# Patient Record
Sex: Female | Born: 1995 | Race: White | Hispanic: No | Marital: Single | Smoking: Never smoker
Health system: Southern US, Community
[De-identification: ages and names within clinical notes are randomized; demographics above are authoritative.]

## PROBLEM LIST (undated history)

## (undated) DIAGNOSIS — N920 Excessive and frequent menstruation with regular cycle: Secondary | ICD-10-CM

## (undated) DIAGNOSIS — R7989 Other specified abnormal findings of blood chemistry: Secondary | ICD-10-CM

## (undated) DIAGNOSIS — E229 Hyperfunction of pituitary gland, unspecified: Secondary | ICD-10-CM

## (undated) DIAGNOSIS — E282 Polycystic ovarian syndrome: Secondary | ICD-10-CM

## (undated) DIAGNOSIS — N946 Dysmenorrhea, unspecified: Secondary | ICD-10-CM

## (undated) HISTORY — PX: TONSILLECTOMY: SUR1361

## (undated) HISTORY — DX: Hyperfunction of pituitary gland, unspecified: E22.9

## (undated) HISTORY — DX: Polycystic ovarian syndrome: E28.2

## (undated) HISTORY — DX: Excessive and frequent menstruation with regular cycle: N92.0

## (undated) HISTORY — DX: Other specified abnormal findings of blood chemistry: R79.89

## (undated) HISTORY — DX: Dysmenorrhea, unspecified: N94.6

---
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
## 2015-01-31 DIAGNOSIS — E282 Polycystic ovarian syndrome: Secondary | ICD-10-CM | POA: Insufficient documentation

## 2015-01-31 DIAGNOSIS — L83 Acanthosis nigricans: Secondary | ICD-10-CM | POA: Insufficient documentation

## 2015-03-20 ENCOUNTER — Encounter: Payer: Self-pay | Admitting: Gynecology

## 2015-03-20 ENCOUNTER — Ambulatory Visit
Admission: EM | Admit: 2015-03-20 | Discharge: 2015-03-20 | Disposition: A | Discharge status: Home / Self Care | Payer: Managed Care, Other (non HMO) | Attending: Family Medicine | Admitting: Family Medicine

## 2015-03-20 DIAGNOSIS — J0110 Acute frontal sinusitis, unspecified: Secondary | ICD-10-CM | POA: Diagnosis not present

## 2015-03-20 DIAGNOSIS — J0100 Acute maxillary sinusitis, unspecified: Secondary | ICD-10-CM | POA: Diagnosis not present

## 2015-03-20 LAB — RAPID STREP SCREEN (MED CTR MEBANE ONLY): Streptococcus, Group A Screen (Direct): NEGATIVE

## 2015-03-20 MED ORDER — PREDNISONE 20 MG PO TABS: 40.0000 mg | ORAL_TABLET | Freq: Every day | ORAL | Status: DC

## 2015-03-20 MED ORDER — AMOXICILLIN-POT CLAVULANATE 875-125 MG PO TABS: 1.0000 | ORAL_TABLET | Freq: Two times a day (BID) | ORAL | Status: DC

## 2015-03-20 MED ORDER — BENZONATATE 100 MG PO CAPS: 100.0000 mg | ORAL_CAPSULE | Freq: Three times a day (TID) | ORAL | Status: DC | PRN

## 2015-03-20 NOTE — Discharge Instructions (Signed)
Take medication as prescribed. Rest. Eat and drink regularly.   Follow up closely with your primary care physician this week as needed. Return to Urgent care for new or worsening concerns.   Sinusitis, Adult Sinusitis is redness, soreness, and inflammation of the paranasal sinuses. Paranasal sinuses are air pockets within the bones of your face. They are located beneath your eyes, in the middle of your forehead, and above your eyes. In healthy paranasal sinuses, mucus is able to drain out, and air is able to circulate through them by way of your nose. However, when your paranasal sinuses are inflamed, mucus and air can become trapped. This can allow bacteria and other germs to grow and cause infection. Sinusitis can develop quickly and last only a short time (acute) or continue over a long period (chronic). Sinusitis that lasts for more than 12 weeks is considered chronic. CAUSES Causes of sinusitis include:  Allergies.  Structural abnormalities, such as displacement of the cartilage that separates your nostrils (deviated septum), which can decrease the air flow through your nose and sinuses and affect sinus drainage.  Functional abnormalities, such as when the small hairs (cilia) that line your sinuses and help remove mucus do not work properly or are not present. SIGNS AND SYMPTOMS Symptoms of acute and chronic sinusitis are the same. The primary symptoms are pain and pressure around the affected sinuses. Other symptoms include:  Upper toothache.  Earache.  Headache.  Bad breath.  Decreased sense of smell and taste.  A cough, which worsens when you are lying flat.  Fatigue.  Fever.  Thick drainage from your nose, which often is green and may contain pus (purulent).  Swelling and warmth over the affected sinuses. DIAGNOSIS Your health care provider will perform a physical exam. During your exam, your health care provider may perform any of the following to help determine if you  have acute sinusitis or chronic sinusitis:  Look in your nose for signs of abnormal growths in your nostrils (nasal polyps).  Tap over the affected sinus to check for signs of infection.  View the inside of your sinuses using an imaging device that has a light attached (endoscope). If your health care provider suspects that you have chronic sinusitis, one or more of the following tests may be recommended:  Allergy tests.  Nasal culture. A sample of mucus is taken from your nose, sent to a lab, and screened for bacteria.  Nasal cytology. A sample of mucus is taken from your nose and examined by your health care provider to determine if your sinusitis is related to an allergy. TREATMENT Most cases of acute sinusitis are related to a viral infection and will resolve on their own within 10 days. Sometimes, medicines are prescribed to help relieve symptoms of both acute and chronic sinusitis. These may include pain medicines, decongestants, nasal steroid sprays, or saline sprays. However, for sinusitis related to a bacterial infection, your health care provider will prescribe antibiotic medicines. These are medicines that will help kill the bacteria causing the infection. Rarely, sinusitis is caused by a fungal infection. In these cases, your health care provider will prescribe antifungal medicine. For some cases of chronic sinusitis, surgery is needed. Generally, these are cases in which sinusitis recurs more than 3 times per year, despite other treatments. HOME CARE INSTRUCTIONS  Drink plenty of water. Water helps thin the mucus so your sinuses can drain more easily.  Use a humidifier.  Inhale steam 3-4 times a day (for example, sit in  the bathroom with the shower running).  Apply a warm, moist washcloth to your face 3-4 times a day, or as directed by your health care provider.  Use saline nasal sprays to help moisten and clean your sinuses.  Take medicines only as directed by your health  care provider.  If you were prescribed either an antibiotic or antifungal medicine, finish it all even if you start to feel better. SEEK IMMEDIATE MEDICAL CARE IF:  You have increasing pain or severe headaches.  You have nausea, vomiting, or drowsiness.  You have swelling around your face.  You have vision problems.  You have a stiff neck.  You have difficulty breathing.   This information is not intended to replace advice given to you by your health care provider. Make sure you discuss any questions you have with your health care provider.   Document Released: 03/05/2005 Document Revised: 03/26/2014 Document Reviewed: 03/20/2011 Elsevier Interactive Patient Education [employer].

## 2015-03-20 NOTE — ED Provider Notes (Signed)
[facility]  ____________________________________________  Time seen: Approximately 3:42 PM  I have reviewed the triage vital signs and the nursing notes.   HISTORY  Chief Complaint URI   HPI Debbie Petersen is a 20 y.o. female presents with mother at bedside for the complaints of 7-8 days of runny nose, sore throat, cough, congestion, sinus pressure.denies known fevers. Reports continues to eat and drink well. Patient reports she is frequently around sick people while she is at work. Reports mother recently with similar.  Reports dry intermittent cough. States sore throat is scratchy and irritating at 5 out of 10.reports symptoms have been unrelieved with over-the-counter cough and congestion medicines and Mucinex.  PCP: Baboffe   History reviewed. No pertinent past medical history.  There are no active problems to display for this patient.   Past Surgical History  Procedure Laterality Date  . Tonsillectomy      Current Outpatient Rx  Name  Route  Sig  Dispense  Refill  .           .           .             Allergies Review of patient's allergies indicates no known allergies.  No family history on file.  Social History Social History  Substance Use Topics  . Smoking status: Never Smoker   . Smokeless tobacco: None  . Alcohol Use: No   LMP: 2 years ago, Depo Provera, Denies chance of pregnancy.   Review of Systems Constitutional: No fever/chills Eyes: No visual changes. ENT: Positive runny nose, congestion, sore throat.  Cardiovascular: Denies chest pain. Respiratory: Denies shortness of breath. Gastrointestinal: No abdominal pain.  No nausea, no vomiting.  No diarrhea.  No constipation. Genitourinary: Negative for dysuria. Musculoskeletal: Negative for back pain. Skin: Negative for rash. Neurological: Negative for headaches, focal weakness or numbness.  10-point ROS otherwise  negative.  ____________________________________________   PHYSICAL EXAM:  VITAL SIGNS: ED Triage Vitals  Enc Vitals Group     BP 03/20/15 1512 144/88 mmHg     Pulse Rate 03/20/15 1512 95     Resp 03/20/15 1512 18     Temp 03/20/15 1512 98.5 F (36.9 C)     Temp Source 03/20/15 1512 Oral     SpO2 03/20/15 1512 100 %     Weight 03/20/15 1512 250 lb (113.399 kg)     Height 03/20/15 1512 5\' 3"  (1.6 m)     Head Cir --      Peak Flow --      Pain Score 03/20/15 1512 7     Pain Loc --      Pain Edu? --      Excl. in GC? --     Constitutional: Alert and oriented. Well appearing and in no acute distress. Eyes: Conjunctivae are normal. PERRL. EOMI. Head: [place]DryCleaner.frAtraumatic.mild to moderate tenderness to palpation bilateral frontal and maxillary sinuses. No swelling. No erythema.  Ears: no erythema, normal TMs bilaterally.   Nose: nasal congestion with bilateral nasal turbinate erythema and edema.  Mouth/Throat: Mucous membranes are moist.  Mild pharyngeal erythema. No tonsillar swelling or exudate. No uvular shift or deviation. Neck: No stridor.  No cervical spine tenderness to palpation. Hematological/Lymphatic/Immunilogical: No cervical lymphadenopathy. Cardiovascular: Normal rate, regular rhythm. Grossly normal heart sounds.  Good peripheral circulation. Respiratory: Normal respiratory effort.  No retractions. Lungs CTAB. No wheezes, rales or rhonchi. dry intermittent cough. Good air movement. Gastrointestinal: Soft and nontender. Obese  abdomen. Musculoskeletal: No lower or upper extremity tenderness nor edema.   Bilateral pedal pulses equal and easily palpated.  Neurologic:  Normal speech and language. No gross focal neurologic deficits are appreciated. No gait instability.  Skin:  Skin is warm, dry and intact. No rash noted. Psychiatric: Mood and affect are normal. Speech and behavior are normal.  ____________________________________________   LABS  (all labs ordered are listed, but  only abnormal results are displayed)  Labs Reviewed  RAPID STREP SCREEN (NOT AT [facility])  CULTURE, GROUP A STREP ([facility] ONLY)   INITIAL IMPRESSION / ASSESSMENT AND PLAN / ED COURSE  Pertinent labs & imaging results that were available during my care of the patient were reviewed by me and considered in my medical decision making (see chart for details).  Very well-appearing patient. No acute distress. Presents for the complaints of 7-8 days of runny nose, nasal congestion, sinus pressure, sore throat and intermittent cough. Lungs clear throughout. Abdomen soft and nontender. Moist mucous membranes. Strep negative, will culture. Will treat sinusitis with oral Augmentin, and when necessary Tessalon Perles and prednisone. Encouraged rest, fluids, over-the-counter Claritin-D and PCP follow-up. Work note given for today and tomorrow.   Discussed follow up with Primary care physician this week. Discussed follow up and return parameters including no resolution or any worsening concerns. Patient verbalized understanding and agreed to plan.   ____________________________________________   FINAL CLINICAL IMPRESSION(S) / ED DIAGNOSES  Final diagnoses:  Acute frontal sinusitis, recurrence not specified  Acute maxillary sinusitis, recurrence not specified       Renford Dills, NP 03/20/15 1654

## 2015-03-20 NOTE — ED Notes (Signed)
Patient c/o coughing/ congestion /sore throat x 1 week.

## 2015-03-22 LAB — CULTURE, GROUP A STREP (THRC)

## 2016-07-20 ENCOUNTER — Encounter: Payer: Self-pay | Admitting: *Deleted

## 2016-07-26 ENCOUNTER — Ambulatory Visit (INDEPENDENT_AMBULATORY_CARE_PROVIDER_SITE_OTHER): Payer: Managed Care, Other (non HMO) | Admitting: Obstetrics and Gynecology

## 2016-07-26 ENCOUNTER — Encounter: Payer: Self-pay | Admitting: Obstetrics and Gynecology

## 2016-07-26 VITALS — BP 124/73 | HR 84 | Ht 62.0 in | Wt 254.7 lb

## 2016-07-26 DIAGNOSIS — Z79899 Other long term (current) drug therapy: Secondary | ICD-10-CM | POA: Diagnosis not present

## 2016-07-26 DIAGNOSIS — Z8742 Personal history of other diseases of the female genital tract: Secondary | ICD-10-CM | POA: Diagnosis not present

## 2016-07-26 MED ORDER — MEDROXYPROGESTERONE ACETATE 150 MG/ML IM SUSP: 150.0000 mg | INTRAMUSCULAR | 3 refills | Status: DC

## 2016-07-26 NOTE — Progress Notes (Signed)
Subjective:     Patient ID: Debbie Petersen, female   DOB: 04-06-1995, 21 y.o.   MRN: [id number]  HPI Has been on Depo for cycle supression & PCOS since 2012, needs refill. Denies any concerns. Is not having menstrual cycle. In school FT and works PT at [employer]. Has been exercising regularly and lost 25 lbs.   Review of Systems Negative     Objective:   Physical Exam A&O x4 Well groomed female in no distress Blood pressure 124/73, pulse 84, height 5\' 2"  (1.575 m), weight 254 lb 11.2 oz (115.5 kg). PE deferred( patient had to leave for work)    Assessment:     PCOS Obesity HC use    Plan:     Refilled Depo, will return at next open appointment for Physical and labs. Instructed to be fasting.  Melody StearnsShambley, CNM

## 2016-07-27 ENCOUNTER — Encounter: Payer: Managed Care, Other (non HMO) | Admitting: Obstetrics and Gynecology

## 2017-04-04 ENCOUNTER — Encounter: Payer: Self-pay | Admitting: Certified Nurse Midwife

## 2017-04-04 ENCOUNTER — Ambulatory Visit (INDEPENDENT_AMBULATORY_CARE_PROVIDER_SITE_OTHER): Payer: Managed Care, Other (non HMO) | Admitting: Certified Nurse Midwife

## 2017-04-04 VITALS — Temp 99.3°F | Ht 62.0 in | Wt 261.2 lb

## 2017-04-04 DIAGNOSIS — R300 Dysuria: Secondary | ICD-10-CM

## 2017-04-04 LAB — POCT URINALYSIS DIPSTICK
BILIRUBIN UA: NEGATIVE
GLUCOSE UA: NEGATIVE
Ketones, UA: NEGATIVE
Nitrite, UA: POSITIVE
Protein, UA: NEGATIVE
Spec Grav, UA: 1.01 (ref 1.010–1.025)
Urobilinogen, UA: 0.2 E.U./dL
pH, UA: 6 (ref 5.0–8.0)

## 2017-04-04 MED ORDER — NITROFURANTOIN MONOHYD MACRO 100 MG PO CAPS: 100.0000 mg | ORAL_CAPSULE | Freq: Two times a day (BID) | ORAL | 1 refills | Status: DC

## 2017-04-04 NOTE — Progress Notes (Signed)
SUBJECTIVE: Debbie Petersen is a 22 y.o. female who complains of urinary frequency, urgency, hematuria and dysuria x five (5) days, without flank pain, fever, chills, or abnormal vaginal discharge or bleeding.   OBJECTIVE: Appears well, in no apparent distress.  Vital signs are normal. The abdomen is soft without tenderness, guarding, mass, rebound or organomegaly. No CVA tenderness or inguinal adenopathy noted. Urine dipstick shows positive for RBC's, positive for nitrates and positive for leukocytes.   ASSESSMENT: Urinary frequency, urinary urgency, hematuria, Dysuria  PLAN: Treatment per orders - also push fluids, may use Uribel prn; samples given. Lab: urine culture, will contact pt via MyChart with results.Call or return to clinic prn if these symptoms worsen or fail to improve as anticipated.   Gunnar BullaJenkins Michelle Latoia Eyster, CNM [facility], [id number]

## 2017-04-04 NOTE — Patient Instructions (Addendum)
Urinary Tract Infection, Adult A urinary tract infection (UTI) is an infection of any part of the urinary tract. The urinary tract includes the:  Kidneys.  Ureters.  Bladder.  Urethra.  These organs make, store, and get rid of pee (urine) in the body. Follow these instructions at home:  Take over-the-counter and prescription medicines only as told by your doctor.  If you were prescribed an antibiotic medicine, take it as told by your doctor. Do not stop taking the antibiotic even if you start to feel better.  Avoid the following drinks: ? Alcohol. ? Caffeine. ? Tea. ? Carbonated drinks.  Drink enough fluid to keep your pee clear or pale yellow.  Keep all follow-up visits as told by your doctor. This is important.  Make sure to: ? Empty your bladder often and completely. Do not to hold pee for long periods of time. ? Empty your bladder before and after sex. ? Wipe from front to back after a bowel movement if you are female. Use each tissue one time when you wipe. Contact a doctor if:  You have back pain.  You have a fever.  You feel sick to your stomach (nauseous).  You throw up (vomit).  Your symptoms do not get better after 3 days.  Your symptoms go away and then come back. Get help right away if:  You have very bad back pain.  You have very bad lower belly (abdominal) pain.  You are throwing up and cannot keep down any medicines or water. This information is not intended to replace advice given to you by your health care provider. Make sure you discuss any questions you have with your health care provider. Document Released: 08/22/2007 Document Revised: 08/11/2015 Document Reviewed: 01/24/2015 Elsevier Interactive Patient Education  2018 Else[employer]  Nitrofurantoin tablets or capsules What is this medicine? NITROFURANTOIN (nye troe fyoor AN toyn) is an antibiotic. It is used to treat urinary tract infections. This medicine may be used for other  purposes; ask your health care provider or pharmacist if you have questions. COMMON BRAND NAME(S): Macrobid, Macrodantin, Urotoin What should I tell my health care provider before I take this medicine? They need to know if you have any of these conditions: -anemia -diabetes -glucose-6-phosphate dehydrogenase deficiency -kidney disease -liver disease -lung disease -other chronic illness -an unusual or allergic reaction to nitrofurantoin, other antibiotics, other medicines, foods, dyes or preservatives -pregnant or trying to get pregnant -breast-feeding How should I use this medicine? Take this medicine by mouth with a glass of water. Follow the directions on the prescription label. Take this medicine with food or milk. Take your doses at regular intervals. Do not take your medicine more often than directed. Do not stop taking except on your doctor's advice. Talk to your pediatrician regarding the use of this medicine in children. While this drug may be prescribed for selected conditions, precautions do apply. Overdosage: If you think you have taken too much of this medicine contact a poison control center or emergency room at once. NOTE: This medicine is only for you. Do not share this medicine with others. What if I miss a dose? If you miss a dose, take it as soon as you can. If it is almost time for your next dose, take only that dose. Do not take double or extra doses. What may interact with this medicine? -antacids containing magnesium trisilicate -probenecid -quinolone antibiotics like ciprofloxacin, lomefloxacin, norfloxacin and ofloxacin -sulfinpyrazone This list may not describe all possible interactions. Give   your health care provider a list of all the medicines, herbs, non-prescription drugs, or dietary supplements you use. Also tell them if you smoke, drink alcohol, or use illegal drugs. Some items may interact with your medicine. What should I watch for while using this  medicine? Tell your doctor or health care professional if your symptoms do not improve or if you get new symptoms. Drink several glasses of water a day. If you are taking this medicine for a long time, visit your doctor for regular checks on your progress. If you are diabetic, you may get a false positive result for sugar in your urine with certain brands of urine tests. Check with your doctor. What side effects may I notice from receiving this medicine? Side effects that you should report to your doctor or health care professional as soon as possible: -allergic reactions like skin rash or hives, swelling of the face, lips, or tongue -chest pain -cough -difficulty breathing -dizziness, drowsiness -fever or infection -joint aches or pains -pale or blue-tinted skin -redness, blistering, peeling or loosening of the skin, including inside the mouth -tingling, burning, pain, or numbness in hands or feet -unusual bleeding or bruising -unusually weak or tired -yellowing of eyes or skin Side effects that usually do not require medical attention (report to your doctor or health care professional if they continue or are bothersome): -dark urine -diarrhea -headache -loss of appetite -nausea or vomiting -temporary hair loss This list may not describe all possible side effects. Call your doctor for medical advice about side effects. You may report side effects to FDA at 1-800-FDA-1088. Where should I keep my medicine? Keep out of the reach of children. Store at room temperature between 15 and 30 degrees C (59 and 86 degrees F). Protect from light. Throw away any unused medicine after the expiration date. NOTE: This sheet is a summary. It may not cover all possible information. If you have questions about this medicine, talk to your doctor, pharmacist, or health care provider.  2018 Elsevier/Gold Standard (2007-09-24 15:56:47)  

## 2017-04-06 LAB — URINE CULTURE

## 2017-04-07 ENCOUNTER — Encounter: Payer: Self-pay | Admitting: Certified Nurse Midwife

## 2018-12-08 ENCOUNTER — Ambulatory Visit (INDEPENDENT_AMBULATORY_CARE_PROVIDER_SITE_OTHER): Payer: Managed Care, Other (non HMO) | Admitting: Certified Nurse Midwife

## 2018-12-08 ENCOUNTER — Other Ambulatory Visit: Payer: Self-pay

## 2018-12-08 ENCOUNTER — Other Ambulatory Visit (HOSPITAL_COMMUNITY)
Admission: RE | Admit: 2018-12-08 | Discharge: 2018-12-08 | Disposition: A | Discharge status: Home / Self Care | Payer: Managed Care, Other (non HMO) | Source: Ambulatory Visit | Attending: Certified Nurse Midwife | Admitting: Certified Nurse Midwife

## 2018-12-08 ENCOUNTER — Encounter: Payer: Self-pay | Admitting: Certified Nurse Midwife

## 2018-12-08 VITALS — BP 132/80 | HR 71 | Ht 62.0 in | Wt 267.8 lb

## 2018-12-08 DIAGNOSIS — Z124 Encounter for screening for malignant neoplasm of cervix: Secondary | ICD-10-CM | POA: Insufficient documentation

## 2018-12-08 DIAGNOSIS — Z01419 Encounter for gynecological examination (general) (routine) without abnormal findings: Secondary | ICD-10-CM | POA: Insufficient documentation

## 2018-12-08 DIAGNOSIS — N912 Amenorrhea, unspecified: Secondary | ICD-10-CM | POA: Diagnosis not present

## 2018-12-08 DIAGNOSIS — Z6841 Body Mass Index (BMI) 40.0 and over, adult: Secondary | ICD-10-CM

## 2018-12-08 DIAGNOSIS — Z8742 Personal history of other diseases of the female genital tract: Secondary | ICD-10-CM | POA: Diagnosis not present

## 2018-12-08 DIAGNOSIS — Z3009 Encounter for other general counseling and advice on contraception: Secondary | ICD-10-CM | POA: Diagnosis not present

## 2018-12-08 LAB — POCT URINE PREGNANCY: Preg Test, Ur: NEGATIVE

## 2018-12-08 MED ORDER — DROSPIRENONE-ETHINYL ESTRADIOL 3-0.02 MG PO TABS: 1.0000 | ORAL_TABLET | Freq: Every day | ORAL | 2 refills | Status: DC

## 2018-12-08 NOTE — Patient Instructions (Addendum)
Polycystic Ovarian Syndrome  Polycystic ovarian syndrome (PCOS) is a common hormonal disorder among women of reproductive age. In most women with PCOS, many small fluid-filled sacs (cysts) grow on the ovaries, and the cysts are not part of a normal menstrual cycle. PCOS can cause problems with your menstrual periods and make it difficult to get pregnant. It can also cause an increased risk of miscarriage with pregnancy. If it is not treated, PCOS can lead to serious health problems, such as diabetes and heart disease. What are the causes? The cause of PCOS is not known, but it may be the result of a combination of certain factors, such as:  Irregular menstrual cycle.  High levels of certain hormones (androgens).  Problems with the hormone that helps to control blood sugar (insulin resistance).  Certain genes. What increases the risk? This condition is more likely to develop in women who have a family history of PCOS. What are the signs or symptoms? Symptoms of PCOS may include:  Multiple ovarian cysts.  Infrequent periods or no periods.  Periods that are too frequent or too heavy.  Unpredictable periods.  Inability to get pregnant (infertility) because of not ovulating.  Increased growth of hair on the face, chest, stomach, back, thumbs, thighs, or toes.  Acne or oily skin. Acne may develop during adulthood, and it may not respond to treatment.  Pelvic pain.  Weight gain or obesity.  Patches of thickened and dark brown or black skin on the neck, arms, breasts, or thighs (acanthosis nigricans).  Excess hair growth on the face, chest, abdomen, or upper thighs (hirsutism). How is this diagnosed? This condition is diagnosed based on:  Your medical history.  A physical exam, including a pelvic exam. Your health care provider may look for areas of increased hair growth on your skin.  Tests, such as: ? Ultrasound. This may be used to examine the ovaries and the lining of the  uterus (endometrium) for cysts. ? Blood tests. These may be used to check levels of sugar (glucose), female hormone (testosterone), and female hormones (estrogen and progesterone) in your blood. How is this treated? There is no cure for PCOS, but treatment can help to manage symptoms and prevent more health problems from developing. Treatment varies depending on:  Your symptoms.  Whether you want to have a baby or whether you need birth control (contraception). Treatment may include nutrition and lifestyle changes along with:  Progesterone hormone to start a menstrual period.  Birth control pills to help you have regular menstrual periods.  Medicines to make you ovulate, if you want to get pregnant.  Medicine to reduce excessive hair growth.  Surgery, in severe cases. This may involve making small holes in one or both of your ovaries. This decreases the amount of testosterone that your body produces. Follow these instructions at home:  Take over-the-counter and prescription medicines only as told by your health care provider.  Follow a healthy meal plan. This can help you reduce the effects of PCOS. ? Eat a healthy diet that includes lean proteins, complex carbohydrates, fresh fruits and vegetables, low-fat dairy products, and healthy fats. Make sure to eat enough fiber.  If you are overweight, lose weight as told by your health care provider. ? Losing 10% of your body weight may improve symptoms. ? Your health care provider can determine how much weight loss is best for you and can help you lose weight safely.  Keep all follow-up visits as told by your health care provider.  This is important. Contact a health care provider if:  Your symptoms do not get better with medicine.  You develop new symptoms. This information is not intended to replace advice given to you by your health care provider. Make sure you discuss any questions you have with your health care provider. Document  Released: 06/29/2004 Document Revised: 02/15/2017 Document Reviewed: 08/21/2015 Elsevier Patient Education  2020 [place] for Polycystic Ovary Syndrome Polycystic ovary syndrome (PCOS) is a disorder of the chemicals (hormones) that regulate a woman's reproductive system, including monthly periods (menstruation). The condition causes important hormones to be out of balance. PCOS can:  Stop your periods or make them irregular.  Cause cysts to develop on your ovaries.  Make it difficult to get pregnant.  Stop your body from responding to the effects of insulin (insulin resistance). Insulin resistance can lead to obesity and diabetes. Changing what you eat can help you manage PCOS and improve your health. Following a balanced diet can help you lose weight and improve the way that your body uses insulin. What are tips for following this plan?  Follow a balanced diet for meals and snacks. Eat breakfast, lunch, dinner, and one or two snacks every day.  Include protein in each meal and snack.  Choose whole grains instead of products that are made with refined flour.  Eat a variety of foods.  Exercise regularly as told by your health care provider. Aim to do 30 or more minutes of exercise on most days of the week.  If you are overweight or obese: ? Pay attention to how many calories you eat. Cutting down on calories can help you lose weight. ? Work with your health care provider or a diet and nutrition specialist (dietitian) to figure out how many calories you need each day. What foods can I eat?  Fruits Include a variety of colors and types. All fruits are helpful for PCOS. Vegetables Include a variety of colors and types. All vegetables are helpful for PCOS. Grains Whole grains, such as whole wheat. Whole-grain breads, crackers, cereals, and pasta. Unsweetened oatmeal, bulgur, barley, quinoa, and brown rice. Tortillas made from corn or whole-wheat flour. Meats and other  proteins Low-fat (lean) proteins, such as fish, chicken, beans, eggs, and tofu. Dairy Low-fat dairy products, such as skim milk, cheese sticks, and yogurt. Beverages Low-fat or fat-free drinks, such as water, low-fat milk, sugar-free drinks, and small amounts of 100% fruit juice. Seasonings and condiments Ketchup. Mustard. Barbecue sauce. Relish. Low-fat or fat-free mayonnaise. Fats and oils Olive oil or canola oil. Walnuts and almonds. The items listed above may not be a complete list of recommended foods and beverages. Contact a dietitian for more options. What foods are not recommended? Foods that are high in calories or fat. Fried foods. Sweets. Products that are made from refined white flour, including white bread, pastries, white rice, and pasta. The items listed above may not be a complete list of foods and beverages to avoid. Contact a dietitian for more information. Summary  PCOS is a hormonal imbalance that affects a woman's reproductive system.  You can help to manage your PCOS by exercising regularly and eating a healthy, varied diet of vegetables, fruit, whole grains, low-fat (lean) protein, and low-fat dairy products.  Changing what you eat can improve the way that your body uses insulin, help your hormones reach normal levels, and help you lose weight. This information is not intended to replace advice given to you by your health care provider.  Make sure you discuss any questions you have with your health care provider. Document Released: 06/27/2015 Document Revised: 06/25/2018 Document Reviewed: 01/07/2017 Elsevier Patient Education  2020 [place] 79-64 Years Old, Female Preventive care refers to visits with your health care provider and lifestyle choices that can promote health and wellness. This includes:  A yearly physical exam. This may also be called an annual well check.  Regular dental visits and eye exams.  Immunizations.  Screening for  certain conditions.  Healthy lifestyle choices, such as eating a healthy diet, getting regular exercise, not using drugs or products that contain nicotine and tobacco, and limiting alcohol use. What can I expect for my preventive care visit? Physical exam Your health care provider will check your:  Height and weight. This may be used to calculate body mass index (BMI), which tells if you are at a healthy weight.  Heart rate and blood pressure.  Skin for abnormal spots. Counseling Your health care provider may ask you questions about your:  Alcohol, tobacco, and drug use.  Emotional well-being.  Home and relationship well-being.  Sexual activity.  Eating habits.  Work and work Statistician.  Method of birth control.  Menstrual cycle.  Pregnancy history. What immunizations do I need?  Influenza (flu) vaccine  This is recommended every year. Tetanus, diphtheria, and pertussis (Tdap) vaccine  You may need a Td booster every 10 years. Varicella (chickenpox) vaccine  You may need this if you have not been vaccinated. Human papillomavirus (HPV) vaccine  If recommended by your health care provider, you may need three doses over 6 months. Measles, mumps, and rubella (MMR) vaccine  You may need at least one dose of MMR. You may also need a second dose. Meningococcal conjugate (MenACWY) vaccine  One dose is recommended if you are age 51-21 years and a first-year college student living in a residence hall, or if you have one of several medical conditions. You may also need additional booster doses. Pneumococcal conjugate (PCV13) vaccine  You may need this if you have certain conditions and were not previously vaccinated. Pneumococcal polysaccharide (PPSV23) vaccine  You may need one or two doses if you smoke cigarettes or if you have certain conditions. Hepatitis A vaccine  You may need this if you have certain conditions or if you travel or work in places where you may  be exposed to hepatitis A. Hepatitis B vaccine  You may need this if you have certain conditions or if you travel or work in places where you may be exposed to hepatitis B. Haemophilus influenzae type b (Hib) vaccine  You may need this if you have certain conditions. You may receive vaccines as individual doses or as more than one vaccine together in one shot (combination vaccines). Talk with your health care provider about the risks and benefits of combination vaccines. What tests do I need?  Blood tests  Lipid and cholesterol levels. These may be checked every 5 years starting at age 14.  Hepatitis C test.  Hepatitis B test. Screening  Diabetes screening. This is done by checking your blood sugar (glucose) after you have not eaten for a while (fasting).  Sexually transmitted disease (STD) testing.  BRCA-related cancer screening. This may be done if you have a family history of breast, ovarian, tubal, or peritoneal cancers.  Pelvic exam and Pap test. This may be done every 3 years starting at age 41. Starting at age 38, this may be done every 5 years if you  have a Pap test in combination with an HPV test. Talk with your health care provider about your test results, treatment options, and if necessary, the need for more tests. Follow these instructions at home: Eating and drinking   Eat a diet that includes fresh fruits and vegetables, whole grains, lean protein, and low-fat dairy.  Take vitamin and mineral supplements as recommended by your health care provider.  Do not drink alcohol if: ? Your health care provider tells you not to drink. ? You are pregnant, may be pregnant, or are planning to become pregnant.  If you drink alcohol: ? Limit how much you have to 0-1 drink a day. ? Be aware of how much alcohol is in your drink. In the [place] one drink equals one 12 oz bottle of beer (355 mL), one 5 oz glass of wine (148 mL), or one 1 oz glass of hard liquor (44  mL). Lifestyle  Take daily care of your teeth and gums.  Stay active. Exercise for at least 30 minutes on 5 or more days each week.  Do not use any products that contain nicotine or tobacco, such as cigarettes, e-cigarettes, and chewing tobacco. If you need help quitting, ask your health care provider.  If you are sexually active, practice safe sex. Use a condom or other form of birth control (contraception) in order to prevent pregnancy and STIs (sexually transmitted infections). If you plan to become pregnant, see your health care provider for a preconception visit. What's next?  Visit your health care provider once a year for a well check visit.  Ask your health care provider how often you should have your eyes and teeth checked.  Stay up to date on all vaccines. This information is not intended to replace advice given to you by your health care provider. Make sure you discuss any questions you have with your health care provider. Document Released: 05/01/2001 Document Revised: 11/14/2017 Document Reviewed: 11/14/2017 Elsevier Patient Education  [place]; Ethinyl Estradiol tablets What is this medicine? DROSPIRENONE; ETHINYL ESTRADIOL (dro SPY re nown; ETH in il es tra DYE ole) is an oral contraceptive (birth control pill). This medicine combines two types of female hormones, an estrogen and a progestin. It is used to prevent ovulation and pregnancy. This medicine may be used for other purposes; ask your health care provider or pharmacist if you have questions. COMMON BRAND NAME(S): Standley Dakins, Lo-Zumandimine, Steele Sizer 28-Day, Ocella, Syeda, Vestura, Lauretta Grill, Zumandimine What should I tell my health care provider before I take this medicine? They need to know if you have or ever had any of these conditions:  abnormal vaginal bleeding  adrenal gland disease  blood vessel disease or blood clots  breast, cervical, endometrial, ovarian,  liver, or uterine cancer  diabetes  gallbladder disease  heart disease or recent heart attack  high blood pressure  high cholesterol  high potassium level  kidney disease  liver disease  migraine headaches  stroke  systemic lupus erythematosus (SLE)  tobacco smoker  an unusual or allergic reaction to estrogens, progestins, or other medicines, foods, dyes, or preservatives  pregnant or trying to get pregnant  breast-feeding How should I use this medicine? Take this medicine by mouth. To reduce nausea, this medicine may be taken with food. Follow the directions on the prescription label. Take this medicine at the same time each day and in the order directed on the package. Do not take your medicine more often than directed. A  patient package insert for the product will be given with each prescription and refill. Read this sheet carefully each time. The sheet may change frequently. Talk to your pediatrician regarding the use of this medicine in children. Special care may be needed. This medicine has been used in female children who have started having menstrual periods. Overdosage: If you think you have taken too much of this medicine contact a poison control center or emergency room at once. NOTE: This medicine is only for you. Do not share this medicine with others. What if I miss a dose? If you miss a dose, refer to the patient information sheet you received with your medicine for direction. If you miss more than one pill, this medicine may not be as effective and you may need to use another form of birth control. What may interact with this medicine? Do not take this medicine with any of the following medications:  aminoglutethimide  amprenavir, fosamprenavir  atazanavir; cobicistat  anastrozole  bosentan  exemestane  letrozole  metyrapone  testolactone This medicine may also interact with the following medications:  acetaminophen  antiviral medicines  for HIV or AIDS  aprepitant  barbiturates  certain antibiotics like rifampin, rifabutin, rifapentine, and possibly penicillins or tetracyclines  certain diuretics like amiloride, spironolactone, triamterene  certain medicines for fungal infections like griseofulvin, ketoconazole, itraconazole  certain medications for high blood pressure or heart conditions like ACE-inhibitors, Angiotensin-II receptor blockers, eplerenone  certain medicines for seizures like carbamazepine, oxcarbazepine, phenobarbital, phenytoin  cholestyramine  cobicistat  corticosteroid like hydrocortisone and prednisolone  cyclosporine  dantrolene  felbamate  grapefruit juice  heparin  lamotrigine  medicines for diabetes, including pioglitazone  modafinil  NSAIDs  potassium supplements  pyrimethamine  raloxifene  St. John's wort  sulfasalazine  tamoxifen  topiramate  thyroid hormones  warfarin his list may not describe all possible interactions. Give your health care provider a list of all the medicines, herbs, non-prescription drugs, or dietary supplements you use. Also tell them if you smoke, drink alcohol, or use illegal drugs. Some items may interact with your medicine. This list may not describe all possible interactions. Give your health care provider a list of all the medicines, herbs, non-prescription drugs, or dietary supplements you use. Also tell them if you smoke, drink alcohol, or use illegal drugs. Some items may interact with your medicine. What should I watch for while using this medicine? Visit your doctor or health care professional for regular checks on your progress. You will need a regular breast and pelvic exam and Pap smear while on this medicine. Use an additional method of contraception during the first cycle that you take these tablets. If you have any reason to think you are pregnant, stop taking this medicine right away and contact your doctor or health care  professional. If you are taking this medicine for hormone related problems, it may take several cycles of use to see improvement in your condition. Smoking increases the risk of getting a blood clot or having a stroke while you are taking birth control pills, especially if you are more than 23 years old. You are strongly advised not to smoke. This medicine can make your body retain fluid, making your fingers, hands, or ankles swell. Your blood pressure can go up. Contact your doctor or health care professional if you feel you are retaining fluid. This medicine can make you more sensitive to the sun. Keep out of the sun. If you cannot avoid being in the sun, wear protective  clothing and use sunscreen. Do not use sun lamps or tanning beds/booths. If you wear contact lenses and notice visual changes, or if the lenses begin to feel uncomfortable, consult your eye care specialist. In some women, tenderness, swelling, or minor bleeding of the gums may occur. Notify your dentist if this happens. Brushing and flossing your teeth regularly may help limit this. See your dentist regularly and inform your dentist of the medicines you are taking. If you are going to have elective surgery, you may need to stop taking this medicine before the surgery. Consult your health care professional for advice. This medicine does not protect you against HIV infection (AIDS) or any other sexually transmitted diseases. What side effects may I notice from receiving this medicine? Side effects that you should report to your doctor or health care professional as soon as possible:  allergic reactions like skin rash, itching or hives, swelling of the face, lips, or tongue  breast tissue changes or discharge  changes in vision  chest pain  confusion, trouble speaking or understanding  dark urine  general ill feeling or flu-like symptoms  light-colored stools  nausea, vomiting  pain, swelling, warmth in the leg  right  upper belly pain  severe headaches  shortness of breath  sudden numbness or weakness of the face, arm or leg  trouble walking, dizziness, loss of balance or coordination  unusual vaginal bleeding  yellowing of the eyes or skin Side effects that usually do not require medical attention (report to your doctor or health care professional if they continue or are bothersome):  acne  brown spots on the face  change in appetite  change in sexual desire  depressed mood or mood swings  fluid retention and swelling  stomach cramps or bloating  unusually weak or tired  weight gain This list may not describe all possible side effects. Call your doctor for medical advice about side effects. You may report side effects to FDA at 1-800-FDA-1088. Where should I keep my medicine? Keep out of the reach of children. Store at room temperature between 15 and 30 degrees C (59 and 86 degrees F). Throw away any unused medicine after the expiration date. NOTE: This sheet is a summary. It may not cover all possible information. If you have questions about this medicine, talk to your doctor, pharmacist, or health care provider.  2020 Elsevier/[employer] Standard (2015-11-25 13:52:56)

## 2018-12-08 NOTE — Progress Notes (Signed)
ANNUAL PREVENTATIVE CARE GYN  ENCOUNTER NOTE  Subjective:       Debbie Petersen is a 23 y.o. G0P0000 female here for a routine annual gynecologic exam.  Current complaints: 1. Needs Pap smear 2. Wishes to restart Depo-provera injections or another form of contraception  History significant for PCOS. Declines flu vaccine.   Denies difficulty breathing or respiratory distress, chest pain, abdominal pain, excessive vaginal bleeding, dysuria, and leg pain or swelling.    Gynecologic History  Patient's last menstrual period was 07/09/2014 (exact date).   Contraception: condoms  Last Pap: due.   Obstetric History  OB History  Gravida Para Term Preterm AB Living  0 0 0 0 0 0  SAB TAB Ectopic Multiple Live Births  0 0 0 0 0    Past Medical History:  Diagnosis Date  . Abnormal thyroid stimulating hormone (TSH) level   . Elevated prolactin level ([place])   . Menorrhagia   . Painful menstrual periods   . PCOS (polycystic ovarian syndrome)     Past Surgical History:  Procedure Laterality Date  . TONSILLECTOMY      Allergies  Allergen Reactions  . Metformin Other (See Comments)    GI upset    Social History   Socioeconomic History  . Marital status: Single    Spouse name: Not on file  . Number of children: Not on file  . Years of education: Not on file  . Highest education level: Not on file  Occupational History  . Not on file  Social Needs  . Financial resource strain: Not on file  . Food insecurity    Worry: Not on file    Inability: Not on file  . Transportation needs    Medical: Not on file    Non-medical: Not on file  Tobacco Use  . Smoking status: Never Smoker  . Smokeless tobacco: Never Used  Substance and Sexual Activity  . Alcohol use: No  . Drug use: No  . Sexual activity: Yes    Birth control/protection: Condom  Lifestyle  . Physical activity    Days per week: Not on file    Minutes per session: Not on file  . Stress: Not on file   Relationships  . Social Herbalist on phone: Not on file    Gets together: Not on file    Attends religious service: Not on file    Active member of club or organization: Not on file    Attends meetings of clubs or organizations: Not on file    Relationship status: Not on file  . Intimate partner violence    Fear of current or ex partner: Not on file    Emotionally abused: Not on file    Physically abused: Not on file    Forced sexual activity: Not on file  Other Topics Concern  . Not on file  Social History Narrative  . Not on file    Family History  Problem Relation Age of Onset  . Cancer Paternal Aunt        breast  . Breast cancer Neg Hx   . Ovarian cancer Neg Hx   . Colon cancer Neg Hx     The following portions of the patient's history were reviewed and updated as appropriate: allergies, current medications, past family history, past medical history, past social history, past surgical history and problem list.  Review of Systems  ROS negative except as noted above. Information obtained from patient.  Objective:   BP 132/80   Pulse 71   Ht 5\' 2"  (1.575 m)   Wt 267 lb 12.8 oz (121.5 kg)   LMP 07/09/2014 (Exact Date)   BMI 48.98 kg/m   CONSTITUTIONAL: Well-developed, well-nourished female in no acute distress.   PSYCHIATRIC: Normal mood and affect. Normal behavior. Normal judgment and thought content.  NEUROLGIC: Alert and oriented to person, place, and time.  Normal muscle tone coordination. No cranial nerve deficit noted.  HENT:  Normocephalic, atraumatic, External right and left ear normal.   EYES: Conjunctivae and EOM are normal. Pupils are equal and round.   NECK: Normal range of motion, supple, no masses.  Normal thyroid.   SKIN: Skin is warm and dry. No rash noted. Not diaphoretic. No erythema. No pallor.  CARDIOVASCULAR: Normal heart rate noted, regular rhythm, no murmur.  RESPIRATORY: Clear to auscultation bilaterally. Effort and  breath sounds normal, no problems with respiration noted.  BREASTS: Symmetric in size. No masses, skin changes, nipple drainage, or lymphadenopathy.  ABDOMEN: Soft, normal bowel sounds, no distention noted.  No tenderness, rebound or guarding. Obese.   PELVIC:  External Genitalia: Normal  Vagina: Normal  Cervix: Normal, Pap collected  Uterus: Normal  Adnexa: Normal  MUSCULOSKELETAL: Normal range of motion. No tenderness.  No cyanosis, clubbing, or edema.  2+ distal pulses.  LYMPHATIC: No Axillary, Supraclavicular, or Inguinal Adenopathy.  Recent Results (from the past 2160 hour(s))  POCT urine pregnancy     Status: None   Collection Time: 12/08/18  3:51 PM  Result Value Ref Range   Preg Test, Ur Negative Negative     Assessment:   Annual gynecologic examination 23 y.o.   Contraception: OCP (estrogen/progesterone), Yaz   Obesity 3   Problem List Items Addressed This Visit    None    Visit Diagnoses    Well woman exam    -  Primary   Relevant Orders   Cytology - PAP   Thyroid Panel With TSH   FSH/LH   Testosterone, Free, Total, SHBG   Prolactin   DHEA-sulfate   Lipid panel   Hemoglobin A1c   Progesterone   History of PCOS       Relevant Orders   Thyroid Panel With TSH   FSH/LH   Testosterone, Free, Total, SHBG   Prolactin   DHEA-sulfate   Lipid panel   Hemoglobin A1c   Progesterone   Screening for cervical cancer       Relevant Orders   Cytology - PAP   Encounter for counseling regarding contraception       BMI 45.0-49.9, adult (HCC)       Relevant Orders   Thyroid Panel With TSH   FSH/LH   Prolactin   Lipid panel   Hemoglobin A1c   Amenorrhea       Relevant Orders   POCT urine pregnancy (Completed)   Thyroid Panel With TSH   FSH/LH   Prolactin      Plan:   Pap: Pap, Reflex if ASCUS  Labs: See orders   Routine preventative health maintenance measures emphasized: Exercise/Diet/Weight control, Tobacco Warnings, Alcohol/Substance use  risks, Stress Management, Peer Pressure Issues and Safe Sex; see AVS  Rx: Yaz, see orders  Reviewed red flag symptoms and when to call  RTC x 1 year for ANNUAL EXAM or sooner if needed   Gunnar BullaJenkins Michelle Leonardo Plaia, CNM [facility], [facility] 12/08/18 4:00 PM

## 2018-12-08 NOTE — Progress Notes (Signed)
Patient here for annual exam.  Patient wants to discuss birth control options.  Previously on Depo, last Depo was >1 year ago.

## 2018-12-16 ENCOUNTER — Other Ambulatory Visit: Payer: Self-pay

## 2018-12-16 ENCOUNTER — Other Ambulatory Visit: Payer: Managed Care, Other (non HMO)

## 2018-12-18 LAB — THYROID PANEL WITH TSH
Free Thyroxine Index: 1.9 (ref 1.2–4.9)
T3 Uptake Ratio: 25 % (ref 24–39)
T4, Total: 7.6 ug/dL (ref 4.5–12.0)
TSH: 3.51 u[IU]/mL (ref 0.450–4.500)

## 2018-12-18 LAB — PROGESTERONE: Progesterone: 0.4 ng/mL

## 2018-12-18 LAB — FSH/LH
FSH: 5.3 m[IU]/mL
LH: 13.3 m[IU]/mL

## 2018-12-18 LAB — LIPID PANEL
Chol/HDL Ratio: 3.5 ratio (ref 0.0–4.4)
Cholesterol, Total: 159 mg/dL (ref 100–199)
HDL: 46 mg/dL (ref >39)
LDL Chol Calc (NIH): 92 mg/dL (ref 0–99)
Triglycerides: 114 mg/dL (ref 0–149)
VLDL Cholesterol Cal: 21 mg/dL (ref 5–40)

## 2018-12-18 LAB — TESTOSTERONE, FREE, TOTAL, SHBG
Sex Hormone Binding: 30.4 nmol/L (ref 24.6–122.0)
Testosterone, Free: 4.4 pg/mL — ABNORMAL HIGH (ref 0.0–4.2)
Testosterone: 61 ng/dL — ABNORMAL HIGH (ref 8–48)

## 2018-12-18 LAB — PROLACTIN: Prolactin: 20.7 ng/mL (ref 4.8–23.3)

## 2018-12-18 LAB — HEMOGLOBIN A1C
Est. average glucose Bld gHb Est-mCnc: 97 mg/dL
Hgb A1c MFr Bld: 5 % (ref 4.8–5.6)

## 2018-12-18 LAB — DHEA-SULFATE: DHEA-SO4: 467 ug/dL — ABNORMAL HIGH (ref 110.0–431.7)

## 2018-12-19 ENCOUNTER — Encounter: Payer: Self-pay | Admitting: Certified Nurse Midwife

## 2019-01-08 ENCOUNTER — Encounter: Payer: Self-pay | Admitting: Certified Nurse Midwife

## 2019-03-03 ENCOUNTER — Other Ambulatory Visit: Payer: Self-pay | Admitting: Certified Nurse Midwife

## 2019-03-30 ENCOUNTER — Ambulatory Visit (INDEPENDENT_AMBULATORY_CARE_PROVIDER_SITE_OTHER): Payer: Managed Care, Other (non HMO) | Admitting: Certified Nurse Midwife

## 2019-03-30 ENCOUNTER — Other Ambulatory Visit: Payer: Self-pay

## 2019-03-30 ENCOUNTER — Encounter: Payer: Self-pay | Admitting: Certified Nurse Midwife

## 2019-03-30 VITALS — BP 117/81 | HR 84 | Ht 62.0 in | Wt 268.1 lb

## 2019-03-30 DIAGNOSIS — N912 Amenorrhea, unspecified: Secondary | ICD-10-CM | POA: Diagnosis not present

## 2019-03-30 DIAGNOSIS — E669 Obesity, unspecified: Secondary | ICD-10-CM | POA: Insufficient documentation

## 2019-03-30 LAB — POCT URINE PREGNANCY: Preg Test, Ur: NEGATIVE

## 2019-03-30 NOTE — Progress Notes (Signed)
GYN ENCOUNTER NOTE  Subjective:       Debbie Petersen is a 24 y.o. G0P0000 female is here for gynecologic evaluation of the following issues:  1. Trying to conceive. Pt has history of PCOS. States that she is 3 days late on cycle and had positive pregnancy test at home. She has been off of yaz for a few months and has had regular cycles . She has used an ovulation kit that has been positive for ovulation.    Gynecologic History Patient's last menstrual period was 03/01/2019. Contraception: none Last Pap: 12/08/18 -active in progress Last mammogram: n/a   Obstetric History OB History  Gravida Para Term Preterm AB Living  0 0 0 0 0 0  SAB TAB Ectopic Multiple Live Births  0 0 0 0 0    Past Medical History:  Diagnosis Date  . Abnormal thyroid stimulating hormone (TSH) level   . Elevated prolactin level   . Menorrhagia   . Painful menstrual periods   . PCOS (polycystic ovarian syndrome)     Past Surgical History:  Procedure Laterality Date  . TONSILLECTOMY      Current Outpatient Medications on File Prior to Visit  Medication Sig Dispense Refill  . [name] 3-0.02 MG tablet TAKE 1 TABLET BY MOUTH EVERY DAY (Patient not taking: Reported on 03/30/2019) 28 tablet 2   No current facility-administered medications on file prior to visit.    Allergies  Allergen Reactions  . Metformin Other (See Comments)    GI upset    Social History   Socioeconomic History  . Marital status: Single    Spouse name: Not on file  . Number of children: Not on file  . Years of education: Not on file  . Highest education level: Not on file  Occupational History  . Not on file  Tobacco Use  . Smoking status: Never Smoker  . Smokeless tobacco: Never Used  Substance and Sexual Activity  . Alcohol use: No  . Drug use: No  . Sexual activity: Yes    Birth control/protection: None  Other Topics Concern  . Not on file  Social History Narrative  . Not on file   Social Determinants of Health    Financial Resource Strain:   . Difficulty of Paying Living Expenses: Not on file  Food Insecurity:   . Worried About Charity fundraiser in the Last Year: Not on file  . Ran Out of Food in the Last Year: Not on file  Transportation Needs:   . Lack of Transportation (Medical): Not on file  . Lack of Transportation (Non-Medical): Not on file  Physical Activity:   . Days of Exercise per Week: Not on file  . Minutes of Exercise per Session: Not on file  Stress:   . Feeling of Stress : Not on file  Social Connections:   . Frequency of Communication with Friends and Family: Not on file  . Frequency of Social Gatherings with Friends and Family: Not on file  . Attends Religious Services: Not on file  . Active Member of Clubs or Organizations: Not on file  . Attends Archivist Meetings: Not on file  . Marital Status: Not on file  Intimate Partner Violence:   . Fear of Current or Ex-Partner: Not on file  . Emotionally Abused: Not on file  . Physically Abused: Not on file  . Sexually Abused: Not on file    Family History  Problem Relation Age of Onset  .  Cancer Paternal Aunt        breast  . Breast cancer Neg Hx   . Ovarian cancer Neg Hx   . Colon cancer Neg Hx     The following portions of the patient's history were reviewed and updated as appropriate: allergies, current medications, past family history, past medical history, past social history, past surgical history and problem list.  Review of Systems Review of Systems - Negative except as mentioend in HPI Review of Systems - General ROS: negative for - chills, fatigue, fever, hot flashes, malaise or night sweats Hematological and Lymphatic ROS: negative for - bleeding problems or swollen lymph nodes Gastrointestinal ROS: negative for - abdominal pain, blood in stools, change in bowel habits and nausea/vomiting Musculoskeletal ROS: negative for - joint pain, muscle pain or muscular weakness Genito-Urinary ROS:  negative for - change in menstrual cycle, dysmenorrhea, dyspareunia, dysuria, genital discharge, genital ulcers, hematuria, incontinence, irregular/heavy menses, nocturia or pelvic pain  Objective:   BP 117/81   Pulse 84   Ht 5' 2"  (1.575 m)   Wt 268 lb 2 oz (121.6 kg)   LMP 03/01/2019   BMI 49.04 kg/m  CONSTITUTIONAL: Well-developed, well-nourished, obese female in no acute distress.  HENT:  Normocephalic, atraumatic.  NECK: Normal range of motion, supple, no masses.  Normal thyroid.  SKIN: Skin is warm and dry. No rash noted. Not diaphoretic. No erythema. No pallor. Louann: Alert and oriented to person, place, and time. PSYCHIATRIC: Normal mood and affect. Normal behavior. Normal judgment and thought content. CARDIOVASCULAR:Not Examined RESPIRATORY: Not Examined BREASTS: Not Examined ABDOMEN: Soft, non distended; Non tender.  No Organomegaly. PELVIC:not indicated to discuss conception  MUSCULOSKELETAL: Normal range of motion. No tenderness.  No cyanosis, clubbing, or edema.     Assessment:   1. Amenorrhea - POCT urine pregnancy - Beta HCG, Quant     Plan:   Discussed irregular cycles with PCOS and use of BC to help regulate cycle. UPT-negative. Beta hcg ordered. Discussed if negative to resume yaz to regulate cycle , then use the ovulation kit and try for 2 more times. IF unsuccessful then will discuss use of clomid to assist with ovulation.She was on Metformin in the past but can not tolerate due to upset stomach.  Also discussed weight loss to improve fertility. She verbalizes understanding. Will follow up with lab results.   I attest more than 50% of visit spent reviewing history discussing PCOS/ovulation/regular cycle/weight loss and fertility . Developing a plan of care. Face to face time 15 min.   Philip Aspen, CNM

## 2019-03-30 NOTE — Patient Instructions (Signed)
Polycystic Ovarian Syndrome  Polycystic ovarian syndrome (PCOS) is a common hormonal disorder among women of reproductive age. In most women with PCOS, many small fluid-filled sacs (cysts) grow on the ovaries, and the cysts are not part of a normal menstrual cycle. PCOS can cause problems with your menstrual periods and make it difficult to get pregnant. It can also cause an increased risk of miscarriage with pregnancy. If it is not treated, PCOS can lead to serious health problems, such as diabetes and heart disease. What are the causes? The cause of PCOS is not known, but it may be the result of a combination of certain factors, such as:  Irregular menstrual cycle.  High levels of certain hormones (androgens).  Problems with the hormone that helps to control blood sugar (insulin resistance).  Certain genes. What increases the risk? This condition is more likely to develop in women who have a family history of PCOS. What are the signs or symptoms? Symptoms of PCOS may include:  Multiple ovarian cysts.  Infrequent periods or no periods.  Periods that are too frequent or too heavy.  Unpredictable periods.  Inability to get pregnant (infertility) because of not ovulating.  Increased growth of hair on the face, chest, stomach, back, thumbs, thighs, or toes.  Acne or oily skin. Acne may develop during adulthood, and it may not respond to treatment.  Pelvic pain.  Weight gain or obesity.  Patches of thickened and dark brown or black skin on the neck, arms, breasts, or thighs (acanthosis nigricans).  Excess hair growth on the face, chest, abdomen, or upper thighs (hirsutism). How is this diagnosed? This condition is diagnosed based on:  Your medical history.  A physical exam, including a pelvic exam. Your health care provider may look for areas of increased hair growth on your skin.  Tests, such as: ? Ultrasound. This may be used to examine the ovaries and the lining of the  uterus (endometrium) for cysts. ? Blood tests. These may be used to check levels of sugar (glucose), female hormone (testosterone), and female hormones (estrogen and progesterone) in your blood. How is this treated? There is no cure for PCOS, but treatment can help to manage symptoms and prevent more health problems from developing. Treatment varies depending on:  Your symptoms.  Whether you want to have a baby or whether you need birth control (contraception). Treatment may include nutrition and lifestyle changes along with:  Progesterone hormone to start a menstrual period.  Birth control pills to help you have regular menstrual periods.  Medicines to make you ovulate, if you want to get pregnant.  Medicine to reduce excessive hair growth.  Surgery, in severe cases. This may involve making small holes in one or both of your ovaries. This decreases the amount of testosterone that your body produces. Follow these instructions at home:  Take over-the-counter and prescription medicines only as told by your health care provider.  Follow a healthy meal plan. This can help you reduce the effects of PCOS. ? Eat a healthy diet that includes lean proteins, complex carbohydrates, fresh fruits and vegetables, low-fat dairy products, and healthy fats. Make sure to eat enough fiber.  If you are overweight, lose weight as told by your health care provider. ? Losing 10% of your body weight may improve symptoms. ? Your health care provider can determine how much weight loss is best for you and can help you lose weight safely.  Keep all follow-up visits as told by your health care provider.   This is important. Contact a health care provider if:  Your symptoms do not get better with medicine.  You develop new symptoms. This information is not intended to replace advice given to you by your health care provider. Make sure you discuss any questions you have with your health care provider. Document  Revised: 02/15/2017 Document Reviewed: 08/21/2015 Elsevier Patient Education  2020 Elsevier Inc.  

## 2019-03-31 ENCOUNTER — Other Ambulatory Visit: Payer: Self-pay | Admitting: Certified Nurse Midwife

## 2019-03-31 DIAGNOSIS — N912 Amenorrhea, unspecified: Secondary | ICD-10-CM

## 2019-03-31 LAB — BETA HCG QUANT (REF LAB): hCG Quant: 41 m[IU]/mL

## 2019-03-31 NOTE — Progress Notes (Signed)
Repeat beta quant ordered for Friday. PT notified via my chart.   Doreene Burke, CNM

## 2019-04-03 ENCOUNTER — Other Ambulatory Visit: Payer: Managed Care, Other (non HMO)

## 2019-04-03 ENCOUNTER — Other Ambulatory Visit: Payer: Self-pay

## 2019-04-03 DIAGNOSIS — N912 Amenorrhea, unspecified: Secondary | ICD-10-CM

## 2019-04-04 LAB — BETA HCG QUANT (REF LAB): hCG Quant: 44 m[IU]/mL

## 2019-04-06 ENCOUNTER — Other Ambulatory Visit: Payer: Self-pay | Admitting: Certified Nurse Midwife

## 2019-04-06 DIAGNOSIS — N912 Amenorrhea, unspecified: Secondary | ICD-10-CM

## 2019-04-06 NOTE — Progress Notes (Signed)
Repeat beta quant level ordered. Pt quant not increasing as expected for valid pregnancy. Pt notified via my chart.   Doreene Burke, CNM

## 2019-04-16 ENCOUNTER — Telehealth: Payer: Self-pay | Admitting: Certified Nurse Midwife

## 2019-04-16 NOTE — Telephone Encounter (Signed)
Pt called in and stated that she has been seen before and her hcg levels where low and the pt was told that she needs higher levels. Then pt started bleeding pt didn't know it was her period pt stated she is still bleeding not as heavy. Pt took a pregnancy test and it came back. But the pt doesn't know if it is true or its her PCOS. Pt didn't know if she needed to be seen. Please let me know if I need to book her an appt. Of if it normal for the pt.   The pt said that she was told that her test with annie did say she was perg.  Please advise

## 2019-04-20 ENCOUNTER — Other Ambulatory Visit: Payer: Self-pay

## 2019-04-20 ENCOUNTER — Other Ambulatory Visit: Payer: Managed Care, Other (non HMO)

## 2019-04-20 DIAGNOSIS — N912 Amenorrhea, unspecified: Secondary | ICD-10-CM

## 2019-04-21 ENCOUNTER — Telehealth: Payer: Self-pay | Admitting: Certified Nurse Midwife

## 2019-04-21 ENCOUNTER — Telehealth: Payer: Self-pay

## 2019-04-21 LAB — BETA HCG QUANT (REF LAB): hCG Quant: 171 m[IU]/mL

## 2019-04-21 NOTE — Telephone Encounter (Signed)
Pt called back and asked why she had not been called back. I told her that we have 24 hours to call back.

## 2019-04-21 NOTE — Telephone Encounter (Signed)
Pt called in and stated that she got her results back from her blood test and the pt stated that she is bleeding the pt is requesting call back. Please advise

## 2019-04-21 NOTE — Telephone Encounter (Signed)
mychart message sent to patient

## 2019-04-28 ENCOUNTER — Other Ambulatory Visit: Payer: Managed Care, Other (non HMO)

## 2019-04-30 ENCOUNTER — Ambulatory Visit (INDEPENDENT_AMBULATORY_CARE_PROVIDER_SITE_OTHER): Payer: Managed Care, Other (non HMO)

## 2019-04-30 ENCOUNTER — Other Ambulatory Visit: Payer: Self-pay | Admitting: Certified Nurse Midwife

## 2019-04-30 ENCOUNTER — Other Ambulatory Visit: Payer: Self-pay

## 2019-04-30 DIAGNOSIS — N939 Abnormal uterine and vaginal bleeding, unspecified: Secondary | ICD-10-CM

## 2019-05-09 ENCOUNTER — Other Ambulatory Visit: Payer: Self-pay | Admitting: Certified Nurse Midwife

## 2019-05-09 DIAGNOSIS — N926 Irregular menstruation, unspecified: Secondary | ICD-10-CM

## 2019-05-09 NOTE — Progress Notes (Signed)
Elevated quant level , u/s does not show pregnancy. Repeat quant ordered.   Doreene Burke, CNM

## 2019-06-03 ENCOUNTER — Other Ambulatory Visit: Payer: Self-pay

## 2019-06-03 ENCOUNTER — Other Ambulatory Visit: Payer: Managed Care, Other (non HMO)

## 2019-06-03 DIAGNOSIS — N926 Irregular menstruation, unspecified: Secondary | ICD-10-CM

## 2019-06-04 LAB — BETA HCG QUANT (REF LAB): hCG Quant: <1 m[IU]/mL

## 2019-06-29 ENCOUNTER — Other Ambulatory Visit: Payer: Self-pay | Admitting: Obstetrics and Gynecology

## 2019-09-19 ENCOUNTER — Other Ambulatory Visit: Payer: Self-pay

## 2019-09-19 ENCOUNTER — Emergency Department (HOSPITAL_COMMUNITY)
Admission: EM | Admit: 2019-09-19 | Discharge: 2019-09-19 | Disposition: A | Discharge status: Home / Self Care | Payer: Managed Care, Other (non HMO) | Attending: Emergency Medicine | Admitting: Emergency Medicine

## 2019-09-19 ENCOUNTER — Encounter (HOSPITAL_COMMUNITY): Payer: Self-pay | Admitting: Physician Assistant

## 2019-09-19 DIAGNOSIS — R21 Rash and other nonspecific skin eruption: Secondary | ICD-10-CM | POA: Insufficient documentation

## 2019-09-19 NOTE — ED Triage Notes (Signed)
Pt c/o rash all over body that started x 2 days ago. Pt states she has been taking benadryl x 2 days. Pt is 3 months pregnant.

## 2019-09-19 NOTE — ED Notes (Signed)
ED Provider at bedside. 

## 2019-09-19 NOTE — Discharge Instructions (Addendum)
As we discussed today I would recommend making sure that your pets are up-to-date on flea/tick treatment, washing your sheets and clothing in hot water, evaluating any close partners for rashes or lesions.    Please take a picture of your rash every day so that when you follow-up with your primary care doctor they can see the rash.  If you develop fevers, worsening symptoms, or have other concerns please seek additional medical care and evaluation.

## 2019-09-19 NOTE — ED Provider Notes (Signed)
[facility] EMERGENCY DEPARTMENT Provider Note   CSN: [id number] Arrival date & time: 09/19/19  4098     History Chief Complaint  Patient presents with  . Rash    Debbie Petersen is a 24 y.o. female with a past medical history of PCOS who presents today for evaluation of a rash.  She reports that 2 to 3 days ago she developed a rash.  It started on her right upper arm, has spread to her abdomen and right leg.  Her significant other does not have similar lesions.  She denies any known exposures, no new soaps lotions detergents or other products.  No new medications.  She does note that she is about 3 months pregnant.  Is not having any spotting or bleeding.  She Benadryl which she says provides mild temporary relief and has tried calamine lotion without significant success.  Denies any known bug exposures including no known bug bites or flea exposure.  HPI     Past Medical History:  Diagnosis Date  . Abnormal thyroid stimulating hormone (TSH) level   . Elevated prolactin level   . Menorrhagia   . Painful menstrual periods   . PCOS (polycystic ovarian syndrome)     Patient Active Problem List   Diagnosis Date Noted  . Obesity 03/30/2019  . PCOS (polycystic ovarian syndrome) 01/31/2015  . Acanthosis nigricans 01/31/2015    Past Surgical History:  Procedure Laterality Date  . TONSILLECTOMY       OB History    Gravida  1   Para  0   Term  0   Preterm  0   AB  0   Living  0     SAB  0   TAB  0   Ectopic  0   Multiple  0   Live Births  0           Family History  Problem Relation Age of Onset  . Cancer Paternal Aunt        breast  . Breast cancer Neg Hx   . Ovarian cancer Neg Hx   . Colon cancer Neg Hx     Social History   Tobacco Use  . Smoking status: Never Smoker  . Smokeless tobacco: Never Used  Vaping Use  . Vaping Use: Never used  Substance Use Topics  . Alcohol use: No  . Drug use: No    Home Medications Prior to Admission  medications   Medication Sig Start Date End Date Taking? Authorizing Provider  [name] 3-0.02 MG tablet TAKE 1 TABLET BY MOUTH EVERY DAY Patient not taking: Reported on 03/30/2019 03/03/19   Hildred Laser, MD    Allergies    Metformin  Review of Systems   Review of Systems  Constitutional: Negative for chills, fatigue and fever.  Respiratory: Negative for cough and shortness of breath.   Gastrointestinal: Negative for abdominal pain.  Genitourinary: Negative for pelvic pain, vaginal bleeding and vaginal discharge.  Musculoskeletal: Negative for back pain.  Skin: Positive for rash.  Neurological: Negative for weakness and headaches.  All other systems reviewed and are negative.   Physical Exam Updated Vital Signs BP 133/66   Pulse 78   Temp 98.6 F (37 C) (Oral)   Resp 18   Ht 5\' 2"  (1.575 m)   Wt 117.9 kg   LMP 07/31/2019   SpO2 100%   BMI 47.55 kg/m   Physical Exam Vitals and nursing note reviewed.  Constitutional:  General: She is not in acute distress.    Appearance: She is not ill-appearing.  HENT:     Head: Normocephalic.  Cardiovascular:     Rate and Rhythm: Normal rate.  Pulmonary:     Effort: Pulmonary effort is normal. No respiratory distress.  Skin:    Comments: There is a maculopapular red rash, primarily under the right arm on the upper arm, right sided abdomen, and bilateral thighs.  No evidence of secondary excoriation or bacterial infection.  There are no lesions in the intertriginous areas.  No vesicles.  Neurological:     Mental Status: She is alert.       ED Results / Procedures / Treatments   Labs (all labs ordered are listed, but only abnormal results are displayed) Labs Reviewed - No data to display  EKG None  Radiology No results found.  Procedures Procedures (including critical care time)  Medications Ordered in ED Medications - No data to display  ED Course  I have reviewed the triage vital signs and the nursing  notes.  Pertinent labs & imaging results that were available during my care of the patient were reviewed by me and considered in my medical decision making (see chart for details).    MDM Rules/Calculators/A&P                          Presents today for evaluation of a rash that started 2 to 3 days ago on her right arm and has spread since.  She is afebrile not tachycardic or tachypneic.  She does not endorse any known exposures.  She does not have evidence of systemic reaction.  She reports she is about 3 months pregnant.  Given this recommended continuing Benadryl, topical calamine lotion and close observation with outpatient follow-up.  Her significant other does not have any lesions therefore lower suspicion for scabies.  No new medications.  Her BP was intermittently elevated while in the ED which she attributes to anxiety.    Suspect she may have insect bite, recommended environmental measures including evaluation of her space for insects such as bed bugs, washing all clothing and sheets in hot water, etc.  Return precautions were discussed with patient who states their understanding.  At the time of discharge patient denied any unaddressed complaints or concerns.  Patient is agreeable for discharge home.  Note: Portions of this report may have been transcribed using voice recognition software. Every effort was made to ensure accuracy; however, inadvertent computerized transcription errors may be present  Final Clinical Impression(s) / ED Diagnoses Final diagnoses:  Rash    Rx / DC Orders ED Discharge Orders    None       Cristina Gong, PA-C 09/19/19 2312    Terald Sleeper, MD 09/20/19 [phone]

## 2019-10-20 LAB — OB RESULTS CONSOLE RUBELLA ANTIBODY, IGM: Rubella: IMMUNE

## 2019-10-20 LAB — OB RESULTS CONSOLE HEPATITIS B SURFACE ANTIGEN: Hepatitis B Surface Ag: NEGATIVE

## 2019-10-20 LAB — OB RESULTS CONSOLE HIV ANTIBODY (ROUTINE TESTING): HIV: NONREACTIVE

## 2019-12-10 ENCOUNTER — Telehealth: Payer: Self-pay

## 2019-12-10 ENCOUNTER — Encounter: Payer: Managed Care, Other (non HMO) | Admitting: Certified Nurse Midwife

## 2019-12-10 NOTE — Telephone Encounter (Signed)
mychart message sent

## 2019-12-10 NOTE — Telephone Encounter (Signed)
Called pt to verify if she is coming in to her appointment on 12/17/19 for an AE. No answer- Left voice mail for pt to return call.

## 2019-12-17 ENCOUNTER — Encounter: Payer: Managed Care, Other (non HMO) | Admitting: Certified Nurse Midwife

## 2020-01-03 ENCOUNTER — Encounter (HOSPITAL_COMMUNITY): Payer: Self-pay

## 2020-01-03 ENCOUNTER — Other Ambulatory Visit: Payer: Self-pay

## 2020-01-03 ENCOUNTER — Emergency Department (HOSPITAL_COMMUNITY)
Admission: EM | Admit: 2020-01-03 | Discharge: 2020-01-03 | Disposition: A | Discharge status: Home / Self Care | Payer: 59 | Attending: Emergency Medicine | Admitting: Emergency Medicine

## 2020-01-03 DIAGNOSIS — S3992XA Unspecified injury of lower back, initial encounter: Secondary | ICD-10-CM | POA: Insufficient documentation

## 2020-01-03 DIAGNOSIS — W010XXA Fall on same level from slipping, tripping and stumbling without subsequent striking against object, initial encounter: Secondary | ICD-10-CM | POA: Diagnosis not present

## 2020-01-03 NOTE — ED Provider Notes (Signed)
[facility] EMERGENCY DEPARTMENT Provider Note   CSN: [id number] Arrival date & time: 01/03/20  1853     History Chief Complaint  Patient presents with  . Fall    Debbie Petersen is a 24 y.o. female.  HPI 24 year old female presents with low back injury.  She slipped on soap that was on the ground and fell backwards striking her back on the ground.  She did not hit her head.  She is now having 8/10 low back pain.  No numbness or weakness in her legs.  No incontinence.  She is around [redacted] weeks pregnant but has not had any abdominal pain or vaginal bleeding/discharge. Has not taken anything for the pain.  Past Medical History:  Diagnosis Date  . Abnormal thyroid stimulating hormone (TSH) level   . Elevated prolactin level   . Menorrhagia   . Painful menstrual periods   . PCOS (polycystic ovarian syndrome)     Patient Active Problem List   Diagnosis Date Noted  . Obesity 03/30/2019  . PCOS (polycystic ovarian syndrome) 01/31/2015  . Acanthosis nigricans 01/31/2015    Past Surgical History:  Procedure Laterality Date  . TONSILLECTOMY       OB History    Gravida  1   Para  0   Term  0   Preterm  0   AB  0   Living  0     SAB  0   TAB  0   Ectopic  0   Multiple  0   Live Births  0           Family History  Problem Relation Age of Onset  . Cancer Paternal Aunt        breast  . Breast cancer Neg Hx   . Ovarian cancer Neg Hx   . Colon cancer Neg Hx     Social History   Tobacco Use  . Smoking status: Never Smoker  . Smokeless tobacco: Never Used  Vaping Use  . Vaping Use: Never used  Substance Use Topics  . Alcohol use: No  . Drug use: No    Home Medications Prior to Admission medications   Medication Sig Start Date End Date Taking? Authorizing Provider  [name] 3-0.02 MG tablet TAKE 1 TABLET BY MOUTH EVERY DAY Patient not taking: Reported on 03/30/2019 03/03/19   Hildred Laser, MD    Allergies    Metformin  Review of Systems     Review of Systems  Genitourinary:       No incontinence  Musculoskeletal: Positive for back pain.  Neurological: Negative for weakness and numbness.    Physical Exam Updated Vital Signs BP (!) 146/81 (BP Location: Right Arm)   Pulse 86   Temp 98.8 F (37.1 C) (Oral)   Resp 18   Ht 5\' 2"  (1.575 m)   Wt 119.3 kg   LMP 07/31/2019   SpO2 100%   BMI 48.10 kg/m   Physical Exam Vitals and nursing note reviewed.  Constitutional:      Appearance: She is well-developed. She is obese.  HENT:     Head: Normocephalic and atraumatic.     Right Ear: External ear normal.     Left Ear: External ear normal.     Nose: Nose normal.  Eyes:     General:        Right eye: No discharge.        Left eye: No discharge.  Cardiovascular:     Rate  and Rhythm: Normal rate and regular rhythm.     Heart sounds: Normal heart sounds.  Pulmonary:     Effort: Pulmonary effort is normal.     Breath sounds: Normal breath sounds.  Abdominal:     Palpations: Abdomen is soft.     Tenderness: There is no abdominal tenderness.  Musculoskeletal:     Thoracic back: No tenderness.     Lumbar back: Tenderness present.  Skin:    General: Skin is warm and dry.  Neurological:     Mental Status: She is alert.     Comments: 5/5 strength in BLE. Normal gross sensation. Is able to ambulate  Psychiatric:        Mood and Affect: Mood is not anxious.     ED Results / Procedures / Treatments   Labs (all labs ordered are listed, but only abnormal results are displayed) Labs Reviewed - No data to display  EKG None  Radiology No results found.  Procedures Procedures (including critical care time)  Medications Ordered in ED Medications - No data to display  ED Course  I have reviewed the triage vital signs and the nursing notes.  Pertinent labs & imaging results that were available during my care of the patient were reviewed by me and considered in my medical decision making (see chart for  details).    MDM Rules/Calculators/A&P                          Patient has some soft tissue tenderness to her lower back without obvious bruising.  I discussed options with patient and given she is pregnant with a ground-level fall, I think lumbar fracture, spinal cord injury, or subluxation/dislocation is unlikely.  We discussed options including x-ray versus supportive care and seeing how she fares from here.  She would like to defer on x-ray at this time which I think is a good option.  She is not sure what take at home and after discussion of risk/benefits of other medicines, we decided to take Tylenol at home along with ice/heat and see how she does.  Follow-up with OB.  We discussed return precautions. Final Clinical Impression(s) / ED Diagnoses Final diagnoses:  Injury of low back, initial encounter    Rx / DC Orders ED Discharge Orders    None       Pricilla Loveless, MD 01/03/20 2133

## 2020-01-03 NOTE — ED Notes (Signed)
Pt in bed, pt states that she fell on her back, pt c/o lower back pain, pt denies numbness or tingling in lower extremities, denies loss of bowel or bladder function.

## 2020-01-03 NOTE — Discharge Instructions (Addendum)
If you develop worsening, recurrent, or continued back pain, numbness or weakness in the legs, incontinence of your bowels or bladders, numbness of your buttocks, fever, abdominal pain, or any other new/concerning symptoms then return to the ER for evaluation.  

## 2020-01-03 NOTE — ED Triage Notes (Signed)
Pt arrives from work via POV c/o Fall at work in [employer], when Pt reports slipping on soap and falling on Lower back. FHR assessed in Triage and Read FHR of 152. No bleeding, No LOC reported or numbness, or tingling.

## 2020-03-01 ENCOUNTER — Inpatient Hospital Stay (HOSPITAL_COMMUNITY)
Admission: AD | Admit: 2020-03-01 | Discharge: 2020-03-13 | DRG: 788 | Disposition: A | Discharge status: Home / Self Care | Payer: Managed Care, Other (non HMO) | Attending: Obstetrics and Gynecology | Admitting: Obstetrics and Gynecology

## 2020-03-01 ENCOUNTER — Encounter (HOSPITAL_COMMUNITY): Payer: Self-pay | Admitting: Emergency Medicine

## 2020-03-01 ENCOUNTER — Other Ambulatory Visit: Payer: Self-pay

## 2020-03-01 DIAGNOSIS — O365930 Maternal care for other known or suspected poor fetal growth, third trimester, not applicable or unspecified: Secondary | ICD-10-CM | POA: Diagnosis present

## 2020-03-01 DIAGNOSIS — Z20822 Contact with and (suspected) exposure to covid-19: Secondary | ICD-10-CM | POA: Diagnosis present

## 2020-03-01 DIAGNOSIS — O99214 Obesity complicating childbirth: Secondary | ICD-10-CM | POA: Diagnosis present

## 2020-03-01 DIAGNOSIS — Z3A30 30 weeks gestation of pregnancy: Secondary | ICD-10-CM

## 2020-03-01 DIAGNOSIS — E669 Obesity, unspecified: Secondary | ICD-10-CM | POA: Diagnosis not present

## 2020-03-01 DIAGNOSIS — Z3A31 31 weeks gestation of pregnancy: Secondary | ICD-10-CM | POA: Diagnosis not present

## 2020-03-01 DIAGNOSIS — O114 Pre-existing hypertension with pre-eclampsia, complicating childbirth: Secondary | ICD-10-CM | POA: Diagnosis not present

## 2020-03-01 DIAGNOSIS — O1413 Severe pre-eclampsia, third trimester: Secondary | ICD-10-CM | POA: Diagnosis present

## 2020-03-01 DIAGNOSIS — Z98891 History of uterine scar from previous surgery: Secondary | ICD-10-CM

## 2020-03-01 DIAGNOSIS — O1002 Pre-existing essential hypertension complicating childbirth: Secondary | ICD-10-CM | POA: Diagnosis present

## 2020-03-01 DIAGNOSIS — O10913 Unspecified pre-existing hypertension complicating pregnancy, third trimester: Secondary | ICD-10-CM | POA: Diagnosis not present

## 2020-03-01 DIAGNOSIS — R030 Elevated blood-pressure reading, without diagnosis of hypertension: Secondary | ICD-10-CM | POA: Diagnosis not present

## 2020-03-01 DIAGNOSIS — O1493 Unspecified pre-eclampsia, third trimester: Secondary | ICD-10-CM

## 2020-03-01 DIAGNOSIS — O113 Pre-existing hypertension with pre-eclampsia, third trimester: Secondary | ICD-10-CM

## 2020-03-01 DIAGNOSIS — O289 Unspecified abnormal findings on antenatal screening of mother: Secondary | ICD-10-CM | POA: Diagnosis not present

## 2020-03-01 DIAGNOSIS — O3663X0 Maternal care for excessive fetal growth, third trimester, not applicable or unspecified: Secondary | ICD-10-CM | POA: Diagnosis not present

## 2020-03-01 DIAGNOSIS — O99213 Obesity complicating pregnancy, third trimester: Secondary | ICD-10-CM

## 2020-03-01 LAB — CBC
HCT: 31.7 % — ABNORMAL LOW (ref 36.0–46.0)
Hemoglobin: 11.6 g/dL — ABNORMAL LOW (ref 12.0–15.0)
MCH: 31.2 pg (ref 26.0–34.0)
MCHC: 36.6 g/dL — ABNORMAL HIGH (ref 30.0–36.0)
MCV: 85.2 fL (ref 80.0–100.0)
Platelets: 199 10*3/uL (ref 150–400)
RBC: 3.72 MIL/uL — ABNORMAL LOW (ref 3.87–5.11)
RDW: 13.2 % (ref 11.5–15.5)
WBC: 14.7 10*3/uL — ABNORMAL HIGH (ref 4.0–10.5)
nRBC: 0 % (ref 0.0–0.2)

## 2020-03-01 LAB — URINALYSIS, ROUTINE W REFLEX MICROSCOPIC
Bilirubin Urine: NEGATIVE
Glucose, UA: NEGATIVE mg/dL
Hgb urine dipstick: NEGATIVE
Ketones, ur: NEGATIVE mg/dL
Leukocytes,Ua: NEGATIVE
Nitrite: NEGATIVE
Protein, ur: >=300 mg/dL — AB
Specific Gravity, Urine: 1.029 (ref 1.005–1.030)
pH: 6 (ref 5.0–8.0)

## 2020-03-01 LAB — COMPREHENSIVE METABOLIC PANEL
ALT: 19 U/L (ref 0–44)
AST: 20 U/L (ref 15–41)
Albumin: 1.9 g/dL — ABNORMAL LOW (ref 3.5–5.0)
Alkaline Phosphatase: 82 U/L (ref 38–126)
Anion gap: 8 (ref 5–15)
BUN: 14 mg/dL (ref 6–20)
CO2: 21 mmol/L — ABNORMAL LOW (ref 22–32)
Calcium: 8.3 mg/dL — ABNORMAL LOW (ref 8.9–10.3)
Chloride: 106 mmol/L (ref 98–111)
Creatinine, Ser: 0.76 mg/dL (ref 0.44–1.00)
GFR, Estimated: >60 mL/min (ref >60)
Glucose, Bld: 84 mg/dL (ref 70–99)
Potassium: 4 mmol/L (ref 3.5–5.1)
Sodium: 135 mmol/L (ref 135–145)
Total Bilirubin: 0.5 mg/dL (ref 0.3–1.2)
Total Protein: 4.3 g/dL — ABNORMAL LOW (ref 6.5–8.1)

## 2020-03-01 LAB — TYPE AND SCREEN
ABO/RH(D): B POS
Antibody Screen: NEGATIVE

## 2020-03-01 LAB — RESP PANEL BY RT-PCR (FLU A&B, COVID) ARPGX2
Influenza A by PCR: NEGATIVE
Influenza B by PCR: NEGATIVE
SARS Coronavirus 2 by RT PCR: NEGATIVE

## 2020-03-01 LAB — PROTEIN / CREATININE RATIO, URINE
Creatinine, Urine: 235.79 mg/dL
Protein Creatinine Ratio: 11.18 mg/mg{Cre} — ABNORMAL HIGH (ref 0.00–0.15)
Total Protein, Urine: 2636 mg/dL

## 2020-03-01 MED ORDER — PRENATAL MULTIVITAMIN CH
1.0000 | ORAL_TABLET | Freq: Every day | ORAL | Status: DC
Start: 2020-03-01 — End: 2020-03-11
  Administered 2020-03-01 – 2020-03-10 (×10): 1 via ORAL
  Filled 2020-03-01 (×10): qty 1

## 2020-03-01 MED ORDER — LACTATED RINGERS IV SOLN: INTRAVENOUS | Status: DC

## 2020-03-01 MED ORDER — NIFEDIPINE ER OSMOTIC RELEASE 30 MG PO TB24
30.0000 mg | ORAL_TABLET | Freq: Once | ORAL | Status: AC
  Administered 2020-03-01: 30 mg via ORAL
  Filled 2020-03-01: qty 1

## 2020-03-01 MED ORDER — MAGNESIUM SULFATE 40 GM/1000ML IV SOLN
2.0000 g/h | INTRAVENOUS | Status: AC
  Administered 2020-03-02 (×2): 2 g/h via INTRAVENOUS
  Filled 2020-03-01 (×3): qty 1000

## 2020-03-01 MED ORDER — ASPIRIN 81 MG PO CHEW
81.0000 mg | CHEWABLE_TABLET | Freq: Every day | ORAL | Status: DC
  Administered 2020-03-01 – 2020-03-03 (×3): 81 mg via ORAL
  Filled 2020-03-01 (×3): qty 1

## 2020-03-01 MED ORDER — LABETALOL HCL 5 MG/ML IV SOLN
80.0000 mg | INTRAVENOUS | Status: DC | PRN
  Administered 2020-03-01: 80 mg via INTRAVENOUS
  Filled 2020-03-01: qty 16

## 2020-03-01 MED ORDER — LABETALOL HCL 5 MG/ML IV SOLN
20.0000 mg | INTRAVENOUS | Status: DC | PRN
  Administered 2020-03-04 – 2020-03-08 (×2): 20 mg via INTRAVENOUS
  Filled 2020-03-01 (×2): qty 4

## 2020-03-01 MED ORDER — CALCIUM CARBONATE ANTACID 500 MG PO CHEW
2.0000 | CHEWABLE_TABLET | ORAL | Status: DC | PRN
  Administered 2020-03-06 (×2): 400 mg via ORAL
  Filled 2020-03-01 (×2): qty 2

## 2020-03-01 MED ORDER — LABETALOL HCL 5 MG/ML IV SOLN
INTRAVENOUS | Status: AC
  Administered 2020-03-01: 20 mg via INTRAVENOUS
  Filled 2020-03-01: qty 4

## 2020-03-01 MED ORDER — NIFEDIPINE ER OSMOTIC RELEASE 30 MG PO TB24
30.0000 mg | ORAL_TABLET | Freq: Every day | ORAL | Status: DC
  Administered 2020-03-01: 30 mg via ORAL
  Filled 2020-03-01: qty 1

## 2020-03-01 MED ORDER — HYDRALAZINE HCL 20 MG/ML IJ SOLN
10.0000 mg | INTRAMUSCULAR | Status: DC | PRN
  Filled 2020-03-01: qty 1

## 2020-03-01 MED ORDER — NIFEDIPINE ER OSMOTIC RELEASE 60 MG PO TB24
60.0000 mg | ORAL_TABLET | Freq: Every day | ORAL | Status: DC
  Administered 2020-03-02 – 2020-03-03 (×2): 60 mg via ORAL
  Filled 2020-03-01 (×4): qty 1

## 2020-03-01 MED ORDER — ACETAMINOPHEN 325 MG PO TABS
650.0000 mg | ORAL_TABLET | ORAL | Status: DC | PRN
  Administered 2020-03-06 (×2): 650 mg via ORAL
  Filled 2020-03-01 (×2): qty 2

## 2020-03-01 MED ORDER — LABETALOL HCL 5 MG/ML IV SOLN
40.0000 mg | INTRAVENOUS | Status: DC | PRN
  Administered 2020-03-01 – 2020-03-04 (×2): 40 mg via INTRAVENOUS
  Filled 2020-03-01 (×2): qty 8

## 2020-03-01 MED ORDER — MAGNESIUM SULFATE BOLUS VIA INFUSION
4.0000 g | Freq: Once | INTRAVENOUS | Status: AC
  Administered 2020-03-01: 4 g via INTRAVENOUS
  Filled 2020-03-01: qty 1000

## 2020-03-01 MED ORDER — BETAMETHASONE SOD PHOS & ACET 6 (3-3) MG/ML IJ SUSP
12.0000 mg | Freq: Once | INTRAMUSCULAR | Status: AC
  Administered 2020-03-01: 12 mg via INTRAMUSCULAR
  Filled 2020-03-01: qty 5

## 2020-03-01 MED ORDER — ZOLPIDEM TARTRATE 5 MG PO TABS: 5.0000 mg | ORAL_TABLET | Freq: Every evening | ORAL | Status: DC | PRN

## 2020-03-01 MED ORDER — DOCUSATE SODIUM 100 MG PO CAPS
100.0000 mg | ORAL_CAPSULE | Freq: Every day | ORAL | Status: DC
  Administered 2020-03-01 – 2020-03-10 (×10): 100 mg via ORAL
  Filled 2020-03-01 (×10): qty 1

## 2020-03-01 NOTE — Lactation Note (Signed)
Lactation Consultation Note  Patient Name: Debbie Petersen Date: 03/01/2020  Mom is a G1.  Mom is currently close to [redacted] weeks pregnant admitted for preeclampsia.   Mom reports she does not have a DEBP yet.  Mom reports private insurance.  Mom plans to breastfeed.  Discussed contacting private insurance for breastpump.  Discussed pumping with in 6 hours of delivery.  Discussed pumping at least 8-12 times day.  Roughly every 3 hours.  Left name on board.  Urged mom to call lactation if she had any questions regarding breastfeeding or pumping.   Maternal Data    Feeding    LATCH Score                   Interventions    Lactation Tools Discussed/Used     Consult Status      Hanif Radin Michaelle Copas 03/01/2020, 8:09 PM

## 2020-03-01 NOTE — Consult Note (Signed)
MFM Note  Debbie Petersen is a 24 year old gravida 1 para 0 who is currently at 30 weeks and 4 days.  She was seen in consultation due to chronic hypertension with superimposed preeclampsia.  The patient presented to the emergency room earlier this morning with the complaints of chest tightness.  She was cleared by the ER and then sent to the MAU.    On admission, her blood pressures were noted to be extremely elevated in the 180's to 190's over 110's-120's range.  She required multiple doses of IV push labetalol for blood pressure control.  She denied any signs or symptoms related to preeclampsia.  Her P/C ratio on admission was 11.18.  The patient was diagnosed with chronic hypertension earlier in her pregnancy that had been treated with Procardia 30 mg XL daily.  Her pregnancy has also been complicated by maternal obesity with a BMI of 49.  She had a P/C ratio earlier in her pregnancy that was 0.09.  She had a growth ultrasound performed in your office on February 23, 2020 showing an EFW of 1197 g (27th percentile for her gestational age).  There was normal amniotic fluid noted at that ultrasound.  Due to superimposed preeclampsia, the patient was admitted to the hospital and placed on magnesium sulfate for seizure prophylaxis and fetal neuroprotection.  She was also given the first dose of antenatal corticosteroids.  Since being admitted to the hospital, the patient reports that her chest tightness has now resolved.  She continues to deny any signs or symptoms of severe preeclampsia.  Her fetal status has been reassuring.  There have been no contractions noted on the toco.  Her PIH labs on admission did not show any abnormalities.  The patient's past medical history includes polycystic ovarian syndrome and obesity.  Her prior surgeries include a tonsillectomy.  The implications and management of preeclampsia was discussed with the patient and her partner. They were advised that preeclampsia can affect  both the mother and the fetus.  In the mother, preeclampsia may cause a rise in blood pressures and it can affect the mother's kidney, liver and platelet functions.  It may also cause the mother to have seizures.  In the fetus, it may cause growth restriction and oligohydramnios.  She understands that delivery is the only treatment for preeclampsia.  Due to preeclampsia with severe range blood pressures, I would recommend inpatient management until delivery.  She may be continued on antihypertensive medications (nifedipine or labetalol) for blood pressure control so that she may reach a more optimal gestational age for delivery.  The goal for her delivery would be 34 weeks.    She should be maintained on magnesium sulfate for maternal seizure prophylaxis and fetal neuroprotection until she completes her antenatal steroid course.  While being observed in the hospital, she should have preeclampsia labs monitored twice a week and biophysical profiles/fluid checks once a week.  Should she require delivery at less than 32 weeks, she should receive magnesium sulfate for fetal neuroprotection.  She should also receive magnesium sulfate for maternal seizure prophylaxis for 24 hours after delivery. A rescue course of antenatal corticosteroids should be given should she require delivery prior to 34 weeks and it has been 7 days or more since she received her initial course.  Delivery prior to 34 weeks would be indicated should she complain of any signs or symptoms of preeclampsia, should her blood pressures be persistently elevated above the 160/100's range despite medication treatment, should she require IV push  antihypertensive medications to control her blood pressures, should there be nonreassuring fetal status, or should her preeclampsia labs show any abnormalities.    The patient understands that usually expectant management of severe preeclampsia is successful in delaying delivery for between 5 to 7 days.   Most patients with superimposed severe preeclampsia will display one of the indications listed above, necessitating delivery following the initial 5 to 7 days of observation.  The patient understands that her baby will require a NICU admission should she be delivered in the near future.  At the end of the consultation, the patient stated that all of her questions have been answered to her complete satisfaction.    Thank you for referring this patient for a Maternal-Fetal Medicine consultation.    Recommendations: Inpatient management until delivery Continue magnesium sulfate until steroid course is completed Continue nifedipine or labetalol for blood pressure control Daily fetal testing  Twice weekly preeclampsia labs while hospitalized Weekly biophysical profile/fluid checks Rescue course of corticosteroids as indicated Delivery at 34 weeks Delivery prior to 34 weeks would be indicated:   Should her blood pressures be persistently greater than 160/100  Should she require IV push medication for blood pressure control  Should she complain of any signs and symptoms of severe preeclampsia  Should her preeclampsia labs show any abnormalities  At any time for nonreassuring fetal status  Magnesium sulfate should be given for maternal seizure prophylaxis at the time of delivery

## 2020-03-01 NOTE — H&P (Signed)
24 y.o. G1P0000 @ [redacted]w[redacted]d presents to the ED with severe range blood pressures and chest pain.  She has likely chronic hypertension diagnosed this pregnancy for which she was started on procardia XL 30mg  daily at 24 weeks.  Her BPs at home have been mostly 140s-150s/80s.  She started to feel poorly overnight and woke up with chest tightness.  Her BP was was 197/116 and 185/124.  She took an extra dose of procardia XL 30 mg and presented to the [facility].  She had an EKG that was cleared by the ED physician and transferred to MAU.  On arrival to MAU, she had persistently severe range BPs that required IV labetalol 20, 40, then 80mg  (most recent dose at 0715).  She denies headache, visual changes, epigastric pain.    Pregnancy c/b: 1. Chronic hypertension:  Undiagnosed prior to pregnancy, but BP at initial OB visit 140/90 and review of Epic shows multiple elevated BPs over last 4 years.  She did not have an initial up:c until 24 weeks which was 0.09.  Repeat up:c at 29 weeks was 6.72.  Growth on 12/7 was EFW 1197 gm (27%, AC 17%).  Has been on low dose aspiring 2. Class 3 obesity: pre-pregnancy BMI 49    Past Medical History:  Diagnosis Date  . Abnormal thyroid stimulating hormone (TSH) level   . Elevated prolactin level   . Menorrhagia   . Painful menstrual periods   . PCOS (polycystic ovarian syndrome)     Past Surgical History:  Procedure Laterality Date  . TONSILLECTOMY      OB History  Gravida Para Term Preterm AB Living  1 0 0 0 0 0  SAB IAB Ectopic Multiple Live Births  0 0 0 0 0    # Outcome Date GA Lbr Len/2nd Weight Sex Delivery Anes PTL Lv  1 Current             Social History   Socioeconomic History  . Marital status: Single    Spouse name: Not on file  . Number of children: Not on file  . Years of education: Not on file  . Highest education level: Not on file  Occupational History  . Not on file  Tobacco Use  . Smoking status: Never Smoker  . Smokeless tobacco: Never  Used  Vaping Use  . Vaping Use: Never used  Substance and Sexual Activity  . Alcohol use: No  . Drug use: No  . Sexual activity: Yes    Birth control/protection: None  Other Topics Concern  . Not on file  Social History Narrative  . Not on file   Social Determinants of Health   Financial Resource Strain: Not on file  Food Insecurity: Not on file  Transportation Needs: Not on file  Physical Activity: Not on file  Stress: Not on file  Social Connections: Not on file  Intimate Partner Violence: Not on file    Metformin    Prenatal Transfer Tool  Maternal Diabetes: No Genetic Screening: Normal Maternal Ultrasounds/Referrals: Normal Fetal Ultrasounds or other Referrals:  None Maternal Substance Abuse:  No Significant Maternal Medications:  Meds include: Other: aspirin 81mg , procardia XL Significant Maternal Lab Results: None  ABO, Rh: --/--/B POS (12/14 0220) Antibody: NEG (12/14 0220) Rubella:  Immune RPR:   NR HBsAg:   Negative HIV:   Negative GBS:   Unknown    Vitals:   03/01/20 0746 03/01/20 0816  BP: (!) 144/88 (!) 147/95  Pulse: 82 78  Resp:    Temp:    SpO2:       General:  NAD Heart: RRR Lungs: CTAB Abdomen:  soft, gravid, obese Ex:  2+ pitting edema to the knees, 3+ DTRs, no clonus SVE:  deferred FHTs:  130s, moderate variability Toco:  quiet   A/P   24 y.o. G1P0000 100w4d presents with severe range blood pressures in the setting of chronic hypertension 1. Admit to AP 2. Severe range BPs requiring multiple doses of IV labetalol.  Urine p:c 11.18 (baseline at 25 weeks 0.09).  Liver function/creatinine / platelets are normal.  We discussed that it can be challenging to distinguish between worsening chronic hypertension vs superimposed preeclampsia.  Given acute onset severe hypertension, LE edema and significantly elevated up:c, I am concerned about severe superimposed preeclampsia.  Will start magnesium sulfate infusion.  Continue procardia XL 30mg   daily (last dose 12 hr ago, will likely need to titrate up but will defer increasing dose until after magnesium bolus is complete).  Recent growth on 12/7 was 27%. Serial labs. Will obtain MFM consultation. 3. Prematurity: start betamethasone series 4. Continuous fetal monitoring while on magnesium sulfate 5. PPx: SCDs    Debbie Petersen Debbie Petersen Debbie Petersen

## 2020-03-01 NOTE — ED Provider Notes (Signed)
Patient seen/examined in the Emergency Department in conjunction with Advanced Practice Provider McDonald Patient reports elevated BP and mild chest tightness.  Denies sob/diaphoresis/vomiting.  Denies pleuritic CP.  Denies ripping/tearing sensation.  Currently [redacted] weeks pregnant Exam : awake/alert, no distress, mild tachycardia, no cardiac murmurs, lung sounds clear Plan: EKG reviewed.  Pt will be transferred to OBGYN for further evaluation and BP management     Zadie Rhine, MD 03/01/20 0210

## 2020-03-01 NOTE — ED Provider Notes (Signed)
[facility] EMERGENCY DEPARTMENT Provider Note   CSN: [id number] Arrival date & time: 03/01/20  0117     History Chief Complaint  Patient presents with   Chest Pain    [redacted] weeks pregnant    Debbie Petersen is a 24 y.o. female with a history of PCOS, menorrhagia who is a G1 P0 female who is 30 weeks and 4 days who presents to the emergency department with a chief complaint of chest pain.  Patient states that she awoke from sleep just prior to arrival to use the restroom when she noticed that she was having tightness in the center of her chest.  She describes her symptoms as "feeling as if she walked up a flight of stairs." No known aggravating or alleviating factors including positional changes or exertion.  She does note that she feels as if her legs are more swollen tonight.  She denies headache, nausea, vomiting, cough, abdominal pain, visual changes, difficulty urinating, back pain, dizziness, lightheadedness, rash, palpitations, neck pain, diaphoresis, fever, or chills.  When she noticed the chest tightness, her fianc checked her blood pressure and it was noted to be 197/116.  She tried to relax and checked it several minutes later and repeat blood pressure was 185/124.  She called her OB/GYN's office and was advised to take a second tablet of her home 30 mg of nifedipine.   In the ER, she continues to rate the chest pain is 4 out of 10.  Although she describes the sensation as feeling as if she walked up a flight of stairs, she denies shortness of breath.  The pain does not feel ripping or tearing.  Pain is not pleuritic.  She reports that she was pain-free and feeling at her baseline before going to bed.  She has been evaluated by her OB/GYN every 2 weeks for preeclampsia.  Last visit was on 12/7 and urine was negative for proteinuria.  She is currently on 30 mg of nifedipine and baby aspirin daily.  She states that her blood pressure has been running 140/100-110  when it has been checked in her OB/GYN's office.  She has not been on bedrest.  No personal or familial history of VTE.  She has a family history of hypertension.  No family history of heart disease.  States that she has had normal fetal movement tonight.  No vaginal bleeding, discharge, or vaginal fluid.  The history is provided by the patient and medical records. No language interpreter was used.       Past Medical History:  Diagnosis Date   Abnormal thyroid stimulating hormone (TSH) level    Elevated prolactin level    Menorrhagia    Painful menstrual periods    PCOS (polycystic ovarian syndrome)     Patient Active Problem List   Diagnosis Date Noted   Obesity 03/30/2019   PCOS (polycystic ovarian syndrome) 01/31/2015   Acanthosis nigricans 01/31/2015    Past Surgical History:  Procedure Laterality Date   TONSILLECTOMY       OB History    Gravida  1   Para  0   Term  0   Preterm  0   AB  0   Living  0     SAB  0   IAB  0   Ectopic  0   Multiple  0   Live Births  0           Family History  Problem Relation Age of Onset  Cancer Paternal Aunt        breast   Breast cancer Neg Hx    Ovarian cancer Neg Hx    Colon cancer Neg Hx     Social History   Tobacco Use   Smoking status: Never Smoker   Smokeless tobacco: Never Used  Vaping Use   Vaping Use: Never used  Substance Use Topics   Alcohol use: No   Drug use: No    Home Medications Prior to Admission medications   Medication Sig Start Date End Date Taking? Authorizing Provider  [name] 3-0.02 MG tablet TAKE 1 TABLET BY MOUTH EVERY DAY Patient not taking: Reported on 03/30/2019 03/03/19   Hildred Laser, MD    Allergies    Metformin  Review of Systems   Review of Systems  Constitutional: Negative for activity change, chills and fever.  HENT: Negative for congestion and sore throat.   Eyes: Negative for visual disturbance.  Respiratory: Positive for chest  tightness. Negative for cough, shortness of breath and wheezing.   Cardiovascular: Positive for chest pain and leg swelling. Negative for palpitations.  Gastrointestinal: Negative for abdominal pain, constipation, diarrhea, nausea and vomiting.  Genitourinary: Negative for dysuria, flank pain, menstrual problem, vaginal bleeding, vaginal discharge and vaginal pain.  Musculoskeletal: Negative for arthralgias, back pain, gait problem, myalgias, neck pain and neck stiffness.  Skin: Negative for rash.  Allergic/Immunologic: Negative for immunocompromised state.  Neurological: Negative for syncope, weakness, numbness and headaches.  Psychiatric/Behavioral: Negative for confusion.    Physical Exam Updated Vital Signs BP (!) 144/114    Pulse (!) 106    Temp 98.6 F (37 C)    Resp 18    LMP 07/31/2019    SpO2 98%   Physical Exam Vitals and nursing note reviewed.  Constitutional:      General: She is not in acute distress.    Appearance: She is obese.     Comments: Nontoxic-appearing.  No acute distress.  HENT:     Head: Normocephalic.     Mouth/Throat:     Mouth: Mucous membranes are moist.  Eyes:     General: No scleral icterus.    Extraocular Movements: Extraocular movements intact.     Conjunctiva/sclera: Conjunctivae normal.     Pupils: Pupils are equal, round, and reactive to light.  Cardiovascular:     Rate and Rhythm: Regular rhythm. Tachycardia present.     Heart sounds: No murmur heard. No friction rub. No gallop.   Pulmonary:     Effort: Pulmonary effort is normal. No respiratory distress.     Breath sounds: No stridor. No wheezing, rhonchi or rales.  Chest:     Chest wall: No tenderness.  Abdominal:     General: There is no distension.     Palpations: Abdomen is soft. There is no mass.     Tenderness: There is no abdominal tenderness. There is no right CVA tenderness, left CVA tenderness, guarding or rebound.     Hernia: No hernia is present.     Comments: Abdomen is  nontender.  Musculoskeletal:     Cervical back: Neck supple.     Comments: Mild non-pitting lower extremity edema bilaterally.  Skin:    General: Skin is warm.     Capillary Refill: Capillary refill takes less than 2 seconds.     Findings: No rash.  Neurological:     Mental Status: She is alert.  Psychiatric:        Behavior: Behavior normal.  ED Results / Procedures / Treatments   Labs (all labs ordered are listed, but only abnormal results are displayed) Labs Reviewed - No data to display  EKG EKG Interpretation  Date/Time:  Tuesday March 01 2020 01:42:18 EST Ventricular Rate:  106 PR Interval:  112 QRS Duration: 72 QT Interval:  320 QTC Calculation: 425 R Axis:   -2 Text Interpretation: Sinus tachycardia Low voltage QRS Cannot rule out Anterior infarct , age undetermined Abnormal ECG No previous ECGs available Confirmed by Zadie Rhine ([id number]) on 03/01/2020 1:48:45 AM   Radiology No results found.  Procedures Procedures (including critical care time)  Medications Ordered in ED Medications - No data to display  ED Course  I have reviewed the triage vital signs and the nursing notes.  Pertinent labs & imaging results that were available during my care of the patient were reviewed by me and considered in my medical decision making (see chart for details).    MDM Rules/Calculators/A&P                          24 year old female with a history of PCOS, menorrhagia who is a G1 P0 female who is 30 weeks and 4 days who presents to the emergency department with a chief complaint of chest pain and tightness, onset tonight accompanied by elevated blood pressure.  Blood pressure was 197/116 when she checked it at home prior to doubling up on her home nifedipine as instructed which she called the on-call line at her OB/GYN's office.  Mild tachycardia in the ER.  Blood pressure is 144/114.  She continues to endorse 4 out of 10 chest tightness.  The patient was seen  and independently evaluated by Dr. Bebe Shaggy, attending physician.  EKG without evidence of ST segment elevation or depression.  No previous available for comparison.  She is endorsing swelling to her bilateral lower legs that is new tonight.  Her presentation is concerning for preeclampsia.  PE, hypertensive urgency or emergency, aortic dissection, and ACS were also considered on the differential diagnosis.  02-27 - Spoke with Raelyn Mora, MAU provider, after a short delay as she was personally transporting a patient to labor and delivery.  She has accepted the patient's for transfer to the MAU.  At time of transfer, patient is hemodynamically stable and in no acute distress.  Final Clinical Impression(s) / ED Diagnoses Final diagnoses:  None    Rx / DC Orders ED Discharge Orders    None       Barkley Boards, PA-C 03/01/20 0241    Zadie Rhine, MD 03/01/20 [phone]

## 2020-03-01 NOTE — MAU Note (Signed)
Sent from ER, pt reports she awakened with chest tightness around MN tonight. B/p at home 180/116, repeat was about the same. Taking procardia 30 mg q day and when she called her provider tonight they told her to take another dose. Denies headache or blurred vision.

## 2020-03-01 NOTE — MAU Note (Signed)
IV attempts x3. Erle Crocker, RN with one attempt. Nancee Liter, RN, with two attempts.   Unable to administer IV Labetalol per protocol. Raelyn Mora, CNM, at bedside and aware.  STAT IV Team consult placed.

## 2020-03-01 NOTE — ED Notes (Addendum)
PA/EDP evaluated patient at triage , triage RN notified MAU on patient's condition .

## 2020-03-01 NOTE — MAU Provider Note (Signed)
History     CSN: [id number]  Arrival date and time: 03/01/20 0117   Event Date/Time   First Provider Initiated Contact with Patient 03/01/20 [phone]      Chief Complaint  Patient presents with  . Chest Pain    [redacted] weeks pregnant  . Hypertension   [name] is a 24 y.o. year old G47P0000 female at [redacted]w[redacted]d weeks gestation who was sent to MAU from [facility] with severe range BPs, but stable condition according to report of Mia, PA. She reports she starting not feeling well last night. She woke up with tightness in her chest, took her BP which was 197/116 and 185/124, called her OB office and was instructed to come to the hospital. She went to [facility], because she didn't know where to go. She denies H/A, blurry vision, dizziness, epigastric pain, N/V. She reports she is being treated for HTN in pregnancy. She take Procardia XL 30 mg daily. She was instructed by the on-call MD to take one before coming to the hospital; which she did. She reports good (+) FM.   OB History    Gravida  1   Para  0   Term  0   Preterm  0   AB  0   Living  0     SAB  0   IAB  0   Ectopic  0   Multiple  0   Live Births  0           Past Medical History:  Diagnosis Date  . Abnormal thyroid stimulating hormone (TSH) level   . Elevated prolactin level   . Menorrhagia   . Painful menstrual periods   . PCOS (polycystic ovarian syndrome)     Past Surgical History:  Procedure Laterality Date  . TONSILLECTOMY      Family History  Problem Relation Age of Onset  . Cancer Paternal Aunt        breast  . Breast cancer Neg Hx   . Ovarian cancer Neg Hx   . Colon cancer Neg Hx     Social History   Tobacco Use  . Smoking status: Never Smoker  . Smokeless tobacco: Never Used  Vaping Use  . Vaping Use: Never used  Substance Use Topics  . Alcohol use: No  . Drug use: No    Allergies:  Allergies  Allergen Reactions  . Metformin Other (See Comments)    GI upset    Medications  Prior to Admission  Medication Sig Dispense Refill Last Dose  . aspirin 81 MG chewable tablet Chew by mouth daily.   02/29/2020 at Unknown time  . NIFEdipine (PROCARDIA-XL/NIFEDICAL-XL) 30 MG 24 hr tablet Take 30 mg by mouth daily.   03/01/2020 at Unknown time  . Prenatal Vit-Fe Fumarate-FA (MULTIVITAMIN-PRENATAL) 27-0.8 MG TABS tablet Take 1 tablet by mouth daily at 12 noon.   02/29/2020 at Unknown time  . [name] 3-0.02 MG tablet TAKE 1 TABLET BY MOUTH EVERY DAY (Patient not taking: Reported on 03/30/2019) 28 tablet 2     Review of Systems  Constitutional: Negative.   HENT: Negative.   Eyes: Negative.   Respiratory: Positive for chest tightness (x 1 episode before calling on-call MD; none since arrival to hospital).   Cardiovascular: Positive for leg swelling.  Gastrointestinal: Negative.   Endocrine: Negative.   Genitourinary: Negative.        (+) FM  Musculoskeletal:       Hands swelling  Skin: Negative.  Allergic/Immunologic: Negative.   Neurological: Negative.   Hematological: Negative.   Psychiatric/Behavioral: Negative.    Physical Exam   Patient Vitals for the past 24 hrs:  BP Temp Temp src Pulse Resp SpO2  03/01/20 0716 (!) 169/112 -- -- 91 -- 100 %  03/01/20 0701 (!) 141/94 -- -- 73 -- 98 %  03/01/20 0631 (!) 146/91 -- -- 72 -- --  03/01/20 0630 -- -- -- -- -- 98 %  03/01/20 0625 -- -- -- -- -- 99 %  03/01/20 0614 (!) 153/93 -- -- -- -- --  03/01/20 0610 -- -- -- -- -- 99 %  03/01/20 0601 (!) 161/99 -- -- 65 -- --  03/01/20 0600 -- -- -- -- -- 100 %  03/01/20 0548 (!) 151/86 -- -- 64 -- --  03/01/20 0531 (!) 148/86 -- -- -- -- --  03/01/20 0523 (!) 146/85 98.4 F (36.9 C) Oral 68 (!) 22 99 %  03/01/20 0500 -- -- -- -- -- 99 %  03/01/20 0446 (!) 167/98 -- -- 72 -- --  03/01/20 0431 (!) 165/99 -- -- 72 -- --  03/01/20 0430 -- -- -- -- -- 99 %  03/01/20 0416 (!) 168/95 -- -- 75 -- --  03/01/20 0415 -- -- -- -- -- 99 %  03/01/20 0402 (!) 164/94 -- -- 73 -- 98 %   03/01/20 0354 (!) 166/93 -- -- 76 -- --  03/01/20 0331 (!) 176/115 -- -- 81 -- --  03/01/20 0330 -- -- -- -- -- 99 %  03/01/20 0325 -- -- -- -- -- 98 %  03/01/20 0324 (!) 165/109 -- -- 87 -- --  03/01/20 0141 (!) 144/114 98.6 F (37 C) -- (!) 106 18 98 %     Physical Exam Vitals and nursing note reviewed.  Constitutional:      Appearance: Normal appearance. She is obese.  HENT:     Head: Normocephalic and atraumatic.  Cardiovascular:     Rate and Rhythm: Tachycardia present.  Pulmonary:     Effort: Pulmonary effort is normal.  Abdominal:     Palpations: Abdomen is soft.  Genitourinary:    Comments: deferred Musculoskeletal:     Cervical back: Normal range of motion.     Right lower leg: Edema (2+ pitting edema up to knee) present.     Left lower leg: Edema (2+ pitting edema up to knee) present.  Skin:    General: Skin is warm and dry.  Neurological:     Mental Status: She is alert and oriented to person, place, and time.  Psychiatric:        Mood and Affect: Mood normal.        Behavior: Behavior normal.        Thought Content: Thought content normal.        Judgment: Judgment normal.    REACTIVE NST - FHR: 135 bpm / moderate variability / accels present / decels absent / TOCO: none MAU Course  Procedures  MDM CCUA CBC CMP P/C Ratio Serial BP's    *Consult with Dr. Macon Large @ [phone] - notified of patient's complaints, assessments, lab & NST results, recommended tx plan call on-call MD recommend admission to [facility]  TC to Dr. Timothy Lasso @ [phone] to notify of Dr. Mont Dutton recommendation - agrees with plan, will sign out to Dr. Chestine Spore and let Dr. Chestine Spore develop a POC.   Results for orders placed or performed during the hospital encounter of 03/01/20 (from the past 24  hour(s))  CBC     Status: Abnormal   Collection Time: 03/01/20  3:19 AM  Result Value Ref Range   WBC 14.7 (H) 4.0 - 10.5 K/uL   RBC 3.72 (L) 3.87 - 5.11 MIL/uL   Hemoglobin 11.6 (L) 12.0 - 15.0 g/dL    HCT 20.3 (L) 55.9 - 46.0 %   MCV 85.2 80.0 - 100.0 fL   MCH 31.2 26.0 - 34.0 pg   MCHC 36.6 (H) 30.0 - 36.0 g/dL   RDW 74.1 63.8 - 45.3 %   Platelets 199 150 - 400 K/uL   nRBC 0.0 0.0 - 0.2 %  Comprehensive metabolic panel     Status: Abnormal   Collection Time: 03/01/20  3:19 AM  Result Value Ref Range   Sodium 135 135 - 145 mmol/L   Potassium 4.0 3.5 - 5.1 mmol/L   Chloride 106 98 - 111 mmol/L   CO2 21 (L) 22 - 32 mmol/L   Glucose, Bld 84 70 - 99 mg/dL   BUN 14 6 - 20 mg/dL   Creatinine, Ser 6.46 0.44 - 1.00 mg/dL   Calcium 8.3 (L) 8.9 - 10.3 mg/dL   Total Protein 4.3 (L) 6.5 - 8.1 g/dL   Albumin 1.9 (L) 3.5 - 5.0 g/dL   AST 20 15 - 41 U/L   ALT 19 0 - 44 U/L   Alkaline Phosphatase 82 38 - 126 U/L   Total Bilirubin 0.5 0.3 - 1.2 mg/dL   GFR, Estimated >80 >32 mL/min   Anion gap 8 5 - 15  Protein / creatinine ratio, urine     Status: Abnormal   Collection Time: 03/01/20  3:19 AM  Result Value Ref Range   Creatinine, Urine 235.79 mg/dL   Total Protein, Urine 2,636 mg/dL   Protein Creatinine Ratio 11.18 (H) 0.00 - 0.15 mg/mg[Cre]  Urinalysis, Routine w reflex microscopic     Status: Abnormal   Collection Time: 03/01/20  3:57 AM  Result Value Ref Range   Color, Urine YELLOW YELLOW   APPearance CLOUDY (A) CLEAR   Specific Gravity, Urine 1.029 1.005 - 1.030   pH 6.0 5.0 - 8.0   Glucose, UA NEGATIVE NEGATIVE mg/dL   Hgb urine dipstick NEGATIVE NEGATIVE   Bilirubin Urine NEGATIVE NEGATIVE   Ketones, ur NEGATIVE NEGATIVE mg/dL   Protein, ur >=122 (A) NEGATIVE mg/dL   Nitrite NEGATIVE NEGATIVE   Leukocytes,Ua NEGATIVE NEGATIVE   RBC / HPF 0-5 0 - 5 RBC/hpf   WBC, UA 21-50 0 - 5 WBC/hpf   Bacteria, UA RARE (A) NONE SEEN   Squamous Epithelial / LPF 6-10 0 - 5   Mucus PRESENT    Hyaline Casts, UA PRESENT      Assessment and Plan  Severe preeclampsia, third trimester - Routine admission orders - Betamethasone 12 mg IM injection - Dr. Chestine Spore to assume care of patient @  0730 - See Dr. Ophelia Charter H&P documentation - Discharge home - Patient verbalized an understanding of the plan of care and agrees.   Raelyn Mora, CNM 03/01/2020, 3:36 AM

## 2020-03-01 NOTE — ED Triage Notes (Signed)
Patient reports elevated BP=187/116 this evening at home with central chest tightness , no SOB , denies emesis or diaphoresis . She is [redacted] weeks pregnant , denies abdominal cramping or contractions , no vaginal bleeding or discharge .

## 2020-03-02 MED ORDER — BETAMETHASONE SOD PHOS & ACET 6 (3-3) MG/ML IJ SUSP
12.0000 mg | Freq: Once | INTRAMUSCULAR | Status: AC
  Administered 2020-03-02: 12 mg via INTRAMUSCULAR

## 2020-03-02 NOTE — Consult Note (Signed)
[name]  Prenatal Consult       03/02/2020  9:28 PM   I was asked by Dr. Henderson Cloud to consult on this patient for possible preterm delivery. I had the pleasure of meeting with [name] and her husband today. She is a 24 year-old G1P0 at [redacted]w[redacted]d today. She is currently hospitalized for pre-eclampsia with severe features, likely with underlying chronic hypertension. She has received betamethasone. The plan is for delivery at 34 weeks, sooner if she becomes symptomatic or there is fetal distress. They are expecting a baby girl, to be named "[name]."  I explained that the neonatal intensive care team would be present for the delivery and outlined the likely delivery room course for this baby including routine resuscitation and NRP-guided approaches to the treatment of respiratory distress. We discussed other common problems associated with prematurity including respiratory distress syndrome/CLD, apnea, feeding issues, and temperature regulation. We briefly discussed IVH/PVL, ROP, and NEC and that these are complications associated with prematurity, but that by 30 weeks are uncommon.   We discussed the average length of stay but I noted that the actual LOS would depend on the severity of problems encountered and response to treatments. We discussed visitation policies and the resources available while their child is in the hospital.  We discussed the importance of good nutrition and various methods of providing nutrition (parenteral hyperalimentation, gavage feedings and/or oral feeding). We discussed the benefits of human milk. Mother intends to breast feed. I encouraged pumping soon after birth and outlined resources that are available to support breast feeding. She has been seen by lactation already during this hospitalization. We discussed the possibility of using donor breast milk as a bridge to which she and her husband are amenable.  Thank you for involving [place] in the care  of this patient. A member of our team will be available should the family have additional questions. Time for consultation approximately 20 minutes with >50% face-to-face in counseling re: prematurity.  Jacob Moores, MD Neonatogist

## 2020-03-02 NOTE — Progress Notes (Signed)
24 y.o. G1P0000 [redacted]w[redacted]d HD#1 admitted for Severe preeclampsia, third trimester [O14.13].  Pt currently stable with no c/o today.  Good FM.  Vitals:   03/02/20 0424 03/02/20 0500 03/02/20 0608 03/02/20 0700  BP: (!) 152/91     Pulse: 79     Resp:  20 18 19   Temp:      TempSrc:      SpO2:      Weight:      Height:        Lungs CTA Cor RRR Abd  Soft, gravid, nontender Ex SCDs FHTs  120s, good short term variability, NST R Toco  none  No results found for this or any previous visit (from the past 24 hour(s)).  A:  HD#1  [redacted]w[redacted]d with severe superimposed preeclampsia at preterm.  Per MFM: Recommendations: Inpatient management until delivery Continue magnesium sulfate until steroid course is completed (done at 7 am tomorrow)  Continue nifedipine or labetalol for blood pressure control Daily fetal testing (continuous while on mag) Twice weekly preeclampsia labs while hospitalized (due Thur) Weekly biophysical profile/fluid checks (Due Thur and Sat) Rescue course of corticosteroids as indicated (nearer time of delivery) Delivery at 34 weeks Delivery prior to 34 weeks would be indicated:             Should her blood pressures be persistently greater than 160/100             Should she require IV push medication for blood pressure control             Should she complain of any signs and symptoms of severe preeclampsia             Should her preeclampsia labs show any abnormalities             At any time for nonreassuring fetal status  Magnesium sulfate should be given for maternal seizure prophylaxis at the time of delivery  Sun

## 2020-03-03 ENCOUNTER — Inpatient Hospital Stay (HOSPITAL_BASED_OUTPATIENT_CLINIC_OR_DEPARTMENT_OTHER): Payer: Managed Care, Other (non HMO)

## 2020-03-03 DIAGNOSIS — O10913 Unspecified pre-existing hypertension complicating pregnancy, third trimester: Secondary | ICD-10-CM

## 2020-03-03 DIAGNOSIS — O113 Pre-existing hypertension with pre-eclampsia, third trimester: Secondary | ICD-10-CM

## 2020-03-03 DIAGNOSIS — O99213 Obesity complicating pregnancy, third trimester: Secondary | ICD-10-CM | POA: Diagnosis not present

## 2020-03-03 DIAGNOSIS — Z3A30 30 weeks gestation of pregnancy: Secondary | ICD-10-CM

## 2020-03-03 LAB — CBC WITH DIFFERENTIAL/PLATELET
Abs Immature Granulocytes: 0.09 10*3/uL — ABNORMAL HIGH (ref 0.00–0.07)
Basophils Absolute: 0 10*3/uL (ref 0.0–0.1)
Basophils Relative: 0 %
Eosinophils Absolute: 0 10*3/uL (ref 0.0–0.5)
Eosinophils Relative: 0 %
HCT: 34.6 % — ABNORMAL LOW (ref 36.0–46.0)
Hemoglobin: 12.7 g/dL (ref 12.0–15.0)
Immature Granulocytes: 1 %
Lymphocytes Relative: 20 %
Lymphs Abs: 2.9 10*3/uL (ref 0.7–4.0)
MCH: 30.6 pg (ref 26.0–34.0)
MCHC: 36.7 g/dL — ABNORMAL HIGH (ref 30.0–36.0)
MCV: 83.4 fL (ref 80.0–100.0)
Monocytes Absolute: 0.9 10*3/uL (ref 0.1–1.0)
Monocytes Relative: 7 %
Neutro Abs: 10.1 10*3/uL — ABNORMAL HIGH (ref 1.7–7.7)
Neutrophils Relative %: 72 %
Platelets: 246 10*3/uL (ref 150–400)
RBC: 4.15 MIL/uL (ref 3.87–5.11)
RDW: 13 % (ref 11.5–15.5)
WBC: 14 10*3/uL — ABNORMAL HIGH (ref 4.0–10.5)
nRBC: 0 % (ref 0.0–0.2)

## 2020-03-03 LAB — COMPREHENSIVE METABOLIC PANEL
ALT: 21 U/L (ref 0–44)
AST: 19 U/L (ref 15–41)
Albumin: 1.8 g/dL — ABNORMAL LOW (ref 3.5–5.0)
Alkaline Phosphatase: 75 U/L (ref 38–126)
Anion gap: 9 (ref 5–15)
BUN: 10 mg/dL (ref 6–20)
CO2: 21 mmol/L — ABNORMAL LOW (ref 22–32)
Calcium: 7.2 mg/dL — ABNORMAL LOW (ref 8.9–10.3)
Chloride: 102 mmol/L (ref 98–111)
Creatinine, Ser: 0.61 mg/dL (ref 0.44–1.00)
GFR, Estimated: >60 mL/min (ref >60)
Glucose, Bld: 103 mg/dL — ABNORMAL HIGH (ref 70–99)
Potassium: 4.4 mmol/L (ref 3.5–5.1)
Sodium: 132 mmol/L — ABNORMAL LOW (ref 135–145)
Total Bilirubin: 0.4 mg/dL (ref 0.3–1.2)
Total Protein: 4.5 g/dL — ABNORMAL LOW (ref 6.5–8.1)

## 2020-03-03 LAB — PROTEIN / CREATININE RATIO, URINE
Creatinine, Urine: 58.46 mg/dL
Protein Creatinine Ratio: 9.56 mg/mg{Cre} — ABNORMAL HIGH (ref 0.00–0.15)
Total Protein, Urine: 559 mg/dL

## 2020-03-03 NOTE — Progress Notes (Addendum)
23 y.o. G1P0000 [redacted]w[redacted]d HD#2 admitted for Severe preeclampsia, third trimester [O14.13].  Pt currently stable with no c/o.  Good FM.  Vitals:   03/03/20 0400 03/03/20 0500 03/03/20 0600 03/03/20 0654  BP: (!) 154/87     Pulse: 78     Resp: 18 17 18 17   Temp: 98.4 F (36.9 C)     TempSrc: Oral     SpO2: 100%     Weight:      Height:        Lungs CTA Cor RRR Abd  Soft, gravid, nontender Ex SCDs FHTs  120s, good short term variability, NST R Toco  rare  Results for orders placed or performed during the hospital encounter of 03/01/20 (from the past 24 hour(s))  Protein / creatinine ratio, urine     Status: Abnormal   Collection Time: 03/02/20 11:52 PM  Result Value Ref Range   Creatinine, Urine 58.46 mg/dL   Total Protein, Urine 559 mg/dL   Protein Creatinine Ratio 9.56 (H) 0.00 - 0.15 mg/mg[Cre]  CBC with Differential/Platelet     Status: Abnormal   Collection Time: 03/03/20  6:32 AM  Result Value Ref Range   WBC 14.0 (H) 4.0 - 10.5 K/uL   RBC 4.15 3.87 - 5.11 MIL/uL   Hemoglobin 12.7 12.0 - 15.0 g/dL   HCT 03/05/20 (L) 80.8 - 81.1 %   MCV 83.4 80.0 - 100.0 fL   MCH 30.6 26.0 - 34.0 pg   MCHC 36.7 (H) 30.0 - 36.0 g/dL   RDW 03.1 59.4 - 58.5 %   Platelets 246 150 - 400 K/uL   nRBC 0.0 0.0 - 0.2 %   Neutrophils Relative % 72 %   Neutro Abs 10.1 (H) 1.7 - 7.7 K/uL   Lymphocytes Relative 20 %   Lymphs Abs 2.9 0.7 - 4.0 K/uL   Monocytes Relative 7 %   Monocytes Absolute 0.9 0.1 - 1.0 K/uL   Eosinophils Relative 0 %   Eosinophils Absolute 0.0 0.0 - 0.5 K/uL   Basophils Relative 0 %   Basophils Absolute 0.0 0.0 - 0.1 K/uL   Immature Granulocytes 1 %   Abs Immature Granulocytes 0.09 (H) 0.00 - 0.07 K/uL    A:  HD#2  [redacted]w[redacted]d with severe preeclampsia s/p two doses of BMZ and magnesium for 24 hours.  P: Per MFM: Recommendations: Inpatient management until delivery Continue magnesium sulfate until steroid course is completed (done at 7 am tomorrow)  Continuenifedipine or  labetalolfor blood pressure control Daily fetal testing (continuous while on mag) Twice weekly preeclampsia labs while hospitalized (due Thur) Weekly biophysical profile/fluid checks (Due Thur and Sat) Rescue course of corticosteroidsas indicated (nearer time of delivery) Delivery at 34 weeks Delivery prior to 34 weeks would be indicated: Should her blood pressures be persistently greater than 160/100 Should she require IV push medication for blood pressure control Should she complain of any signs and symptoms of severe preeclampsia Should her preeclampsia labs show any abnormalities At any time for nonreassuring fetal status  Magnesium sulfate should be given for maternal seizure prophylaxis at the time of delivery  1. MgSO4 off at about 10 this am. 2. NSTs daily.   3. Continue procardia 60 mg XL.  BPs are borderline but not severe range.  3. BPP due today, will get presentation as well. 4. S/p BMZ x 2. 5. NICU consult done.    Tue

## 2020-03-03 NOTE — Progress Notes (Signed)
Vitals:   03/03/20 0802 03/03/20 0900 03/03/20 1000 03/03/20 1308  BP: (!) 156/81   (!) 155/88  Pulse: 77   82  Resp: 18 18 18 18   Temp: 98.8 F (37.1 C)   98.3 F (36.8 C)  TempSrc: Oral   Oral  SpO2: 100%   98%  Weight:      Height:       results today  AFI 9.96; VTX.  BPP 6/8.  FHTs this am 120s, gSTV, NST R Toco occ  BPP total 8/10 with NST.

## 2020-03-04 ENCOUNTER — Encounter (HOSPITAL_COMMUNITY): Payer: Self-pay | Admitting: Obstetrics and Gynecology

## 2020-03-04 LAB — COMPREHENSIVE METABOLIC PANEL
ALT: 20 U/L (ref 0–44)
AST: 18 U/L (ref 15–41)
Albumin: 1.7 g/dL — ABNORMAL LOW (ref 3.5–5.0)
Alkaline Phosphatase: 61 U/L (ref 38–126)
Anion gap: 7 (ref 5–15)
BUN: 11 mg/dL (ref 6–20)
CO2: 23 mmol/L (ref 22–32)
Calcium: 7.5 mg/dL — ABNORMAL LOW (ref 8.9–10.3)
Chloride: 105 mmol/L (ref 98–111)
Creatinine, Ser: 0.61 mg/dL (ref 0.44–1.00)
GFR, Estimated: >60 mL/min (ref >60)
Glucose, Bld: 80 mg/dL (ref 70–99)
Potassium: 4.3 mmol/L (ref 3.5–5.1)
Sodium: 135 mmol/L (ref 135–145)
Total Bilirubin: 0.6 mg/dL (ref 0.3–1.2)
Total Protein: 4.2 g/dL — ABNORMAL LOW (ref 6.5–8.1)

## 2020-03-04 LAB — CBC
HCT: 34.3 % — ABNORMAL LOW (ref 36.0–46.0)
Hemoglobin: 12.1 g/dL (ref 12.0–15.0)
MCH: 30.3 pg (ref 26.0–34.0)
MCHC: 35.3 g/dL (ref 30.0–36.0)
MCV: 86 fL (ref 80.0–100.0)
Platelets: 234 10*3/uL (ref 150–400)
RBC: 3.99 MIL/uL (ref 3.87–5.11)
RDW: 13.2 % (ref 11.5–15.5)
WBC: 13 10*3/uL — ABNORMAL HIGH (ref 4.0–10.5)
nRBC: 0.5 % — ABNORMAL HIGH (ref 0.0–0.2)

## 2020-03-04 MED ORDER — NIFEDIPINE ER OSMOTIC RELEASE 30 MG PO TB24
90.0000 mg | ORAL_TABLET | Freq: Every day | ORAL | Status: DC
  Administered 2020-03-04 – 2020-03-08 (×5): 90 mg via ORAL
  Filled 2020-03-04: qty 3
  Filled 2020-03-04: qty 1
  Filled 2020-03-04 (×4): qty 3

## 2020-03-04 MED ORDER — BETAMETHASONE SOD PHOS & ACET 6 (3-3) MG/ML IJ SUSP: 12.0000 mg | Freq: Once | INTRAMUSCULAR | Status: DC

## 2020-03-04 NOTE — Plan of Care (Signed)

## 2020-03-04 NOTE — Progress Notes (Addendum)
HD#4 31.0 with CHTN with superimposed preeclampsia being monitored. Patient asymptomatic for PEC complaints.  Had severe range BP requiring 40mg  IV labetalol approx 4am. BPs: currently mild range ALT/AST/Plt wnl today  A/P: Per MFM: Recommendations: guided by MFM Inpatient management until delivery S/p magnesium sulfate at admission for neuroprotection, now s/p beta x 2 12/14 and 12/15 Continuenifedipine, discussed overnight IV labetalol use with Dr. 1/16 MFM, he recommended increasing Procardia XL to 90mg  daily.  If needing further IV antihypertensive d/t perisistent severe BP would restart Mg SO4 for PEC prophylaxis and proceed with IOL Daily fetal testing Twice weekly preeclampsia labs while hospitalized Weekly biophysical profile/fluid checks(Due Thur and Sat) Rescue course of corticosteroidsas indicated(nearer time of delivery) Delivery at 34 weeks Delivery prior to 34 weeks would be indicated: Should her blood pressures be persistently greater than 160/100 Should she require IV push medication for blood pressure control Should she complain of any signs and symptoms of severe preeclampsia Should her preeclampsia labs show any abnormalities At any time for nonreassuring fetal status

## 2020-03-04 NOTE — Progress Notes (Signed)
24 y.o. G1P0000 [redacted]w[redacted]d HD#3 admitted for Severe preeclampsia, third trimester [O14.13].  Now having severe blood pressures and needing labetalol 40 mg.  No HA or other sx of severe preeclampsia.  Vitals:   03/04/20 0408 03/04/20 0410 03/04/20 0421 03/04/20 0443  BP: (!) 157/107 (!) 164/104 (!) 176/88 (!) 173/87  Pulse: 74 74 72 82  Resp: 18 18 17 19   Temp:      TempSrc:      SpO2:      Weight:      Height:        Abd  Soft, gravid, nontender Ex SCDs FHTs  P Toco  occ  Results for orders placed or performed during the hospital encounter of 03/01/20 (from the past 24 hour(s))  Comprehensive metabolic panel     Status: Abnormal   Collection Time: 03/03/20  6:32 AM  Result Value Ref Range   Sodium 132 (L) 135 - 145 mmol/L   Potassium 4.4 3.5 - 5.1 mmol/L   Chloride 102 98 - 111 mmol/L   CO2 21 (L) 22 - 32 mmol/L   Glucose, Bld 103 (H) 70 - 99 mg/dL   BUN 10 6 - 20 mg/dL   Creatinine, Ser 03/05/20 0.44 - 1.00 mg/dL   Calcium 7.2 (L) 8.9 - 10.3 mg/dL   Total Protein 4.5 (L) 6.5 - 8.1 g/dL   Albumin 1.8 (L) 3.5 - 5.0 g/dL   AST 19 15 - 41 U/L   ALT 21 0 - 44 U/L   Alkaline Phosphatase 75 38 - 126 U/L   Total Bilirubin 0.4 0.3 - 1.2 mg/dL   GFR, Estimated 1.82 >99 mL/min   Anion gap 9 5 - 15  CBC with Differential/Platelet     Status: Abnormal   Collection Time: 03/03/20  6:32 AM  Result Value Ref Range   WBC 14.0 (H) 4.0 - 10.5 K/uL   RBC 4.15 3.87 - 5.11 MIL/uL   Hemoglobin 12.7 12.0 - 15.0 g/dL   HCT 03/05/20 (L) 16.9 - 67.8 %   MCV 83.4 80.0 - 100.0 fL   MCH 30.6 26.0 - 34.0 pg   MCHC 36.7 (H) 30.0 - 36.0 g/dL   RDW 93.8 10.1 - 75.1 %   Platelets 246 150 - 400 K/uL   nRBC 0.0 0.0 - 0.2 %   Neutrophils Relative % 72 %   Neutro Abs 10.1 (H) 1.7 - 7.7 K/uL   Lymphocytes Relative 20 %   Lymphs Abs 2.9 0.7 - 4.0 K/uL   Monocytes Relative 7 %   Monocytes Absolute 0.9 0.1 - 1.0 K/uL   Eosinophils Relative 0 %   Eosinophils Absolute 0.0 0.0 - 0.5 K/uL   Basophils Relative 0 %    Basophils Absolute 0.0 0.0 - 0.1 K/uL   Immature Granulocytes 1 %   Abs Immature Granulocytes 0.09 (H) 0.00 - 0.07 K/uL    A:  HD#3  [redacted]w[redacted]d with severe preeclampsia now with multiple severe range BPs over her procardia and labetalol. BPs have been in 150s/90s for last several days.    P: 1.  Moved to L&D in case of needing delivery. 2.  Will give last dose of BMZ now. 3.  Needs magnesium sulfate if labor.  4.  VTX.   [redacted]w[redacted]d

## 2020-03-05 NOTE — Progress Notes (Signed)
  HD#5 Patient is comfortable without complaints.  She reports no HA, vision change, RUQ or epigastric pain.  NO LOF or VB, no contractions and reports good fetal movement.  Vitals:   03/04/20 1953 03/04/20 2320 03/05/20 0321 03/05/20 0745  BP:  (!) 141/76 (!) 151/87 (!) 155/91  Pulse: 75 78 76 76  Resp:  18 18   Temp:  98.6 F (37 C) 98.6 F (37 C) 98.6 F (37 C)  TempSrc:  Oral Oral Oral  SpO2:  100% 100% 99%  Weight:      Height:        Abd: gravid, nontender Ext: no calf tenderness   Lab Results  Component Value Date   WBC 13.0 (H) 03/04/2020   HGB 12.1 03/04/2020   HCT 34.3 (L) 03/04/2020   MCV 86.0 03/04/2020   PLT 234 03/04/2020    --/--/B POS (12/14 0220)   A/P: 31.1 with CHTN with superimposed preeclampsia Recommendations: guided by MFM Inpatient management until delivery S/p magnesium sulfate at admission for neuroprotection, now s/p beta x 2 12/14 and 12/15 Continuenifedipine XL 90mg  qd. If needing further IV antihypertensive d/t perisistent severe BP would restart Mg SO4 for PEC prophylaxis and proceed with IOL Daily fetal testing, pending today Twice weekly preeclampsia labs while hospitalized, last labs 12/17- ALT/AST/Plt wnl Weekly biophysical profile/fluid checks, last performed 12/16, will be due 12/23 Rescue course of corticosteroidsas indicated(nearer time of delivery) Delivery at 34 weeks Delivery prior to 34 weeks would be indicated: Should her blood pressures be persistently greater than 160/100 Should she require IV push medication for blood pressure control Should she complain of any signs and symptoms of severe preeclampsia Should her preeclampsia labs show any abnormalities At any time for nonreassuring fetal status   1/24

## 2020-03-06 MED ORDER — FAMOTIDINE IN NACL 20-0.9 MG/50ML-% IV SOLN
20.0000 mg | Freq: Once | INTRAVENOUS | Status: AC
  Administered 2020-03-06: 20 mg via INTRAVENOUS
  Filled 2020-03-06: qty 50

## 2020-03-06 NOTE — Progress Notes (Signed)
HD#6 with CHTN with superimposed severe PEC.  Patient reports she woke up with "stomach pain" and a moderate right frontal HA.  Was given tums and tylenol and both have improved and are mild.  She denies epigastric/RUQ pain or vision changes.  Vitals:   03/06/20 0041 03/06/20 0610 03/06/20 0611 03/06/20 0831  BP: (!) 143/79  (!) 152/108 (!) 144/83  Pulse: 84  83 71  Resp:   18 18  Temp:   98.5 F (36.9 C) 98.5 F (36.9 C)  TempSrc:   Oral Oral  SpO2:  100% 100% 99%  Weight:      Height:       Abd: gravid, nontender Ext: no calf tenderness  Lab Results  Component Value Date   WBC 13.0 (H) 03/04/2020   HGB 12.1 03/04/2020   HCT 34.3 (L) 03/04/2020   MCV 86.0 03/04/2020   PLT 234 03/04/2020    --/--/B POS (12/14 0220)  A/P HD#5  CHTN with superimposed severe PEC. Recommendations: guided by MFM Inpatient management until delivery S/p magnesium sulfate at admission for neuroprotection, now s/p beta x 2 12/14 and 12/15 BPs normal to mild range on nifedipine 90mg  qd.  If needing further IV antihypertensive d/t perisistent severe BP would restart Mg SO4 for PEC prophylaxis and proceed with IOL Pt with new HA and upper abd discomfort this morning treated with tums and tylenol and per pt now almost resolved.  Discussed PEC symptoms to monitor and advised to alert RN if symptoms recur. Daily fetal testing- pending today, yesterday am NST Cat 1 Twice weekly preeclampsia labs while hospitalized, last labs 12/17- ALT/AST/Plt wnl, will order for tomorrow Weekly biophysical profile/fluid checks, last performed 12/16, will be due 12/23 Rescue course of corticosteroidsas indicated(nearer time of delivery) Delivery at 34 weeks Delivery prior to 34 weeks would be indicated: Should her blood pressures be persistently greater than 160/100 Should she require IV push medication for blood pressure control Should she complain of any signs and symptoms of severe  preeclampsia Should her preeclampsia labs show any abnormalities At any time for nonreassuring fetal status  1/24

## 2020-03-07 LAB — CBC
HCT: 35.4 % — ABNORMAL LOW (ref 36.0–46.0)
Hemoglobin: 12.4 g/dL (ref 12.0–15.0)
MCH: 30.1 pg (ref 26.0–34.0)
MCHC: 35 g/dL (ref 30.0–36.0)
MCV: 85.9 fL (ref 80.0–100.0)
Platelets: 176 10*3/uL (ref 150–400)
RBC: 4.12 MIL/uL (ref 3.87–5.11)
RDW: 13 % (ref 11.5–15.5)
WBC: 13.2 10*3/uL — ABNORMAL HIGH (ref 4.0–10.5)
nRBC: 0 % (ref 0.0–0.2)

## 2020-03-07 LAB — COMPREHENSIVE METABOLIC PANEL
ALT: 22 U/L (ref 0–44)
AST: 22 U/L (ref 15–41)
Albumin: 1.6 g/dL — ABNORMAL LOW (ref 3.5–5.0)
Alkaline Phosphatase: 64 U/L (ref 38–126)
Anion gap: 5 (ref 5–15)
BUN: 14 mg/dL (ref 6–20)
CO2: 23 mmol/L (ref 22–32)
Calcium: 8.5 mg/dL — ABNORMAL LOW (ref 8.9–10.3)
Chloride: 106 mmol/L (ref 98–111)
Creatinine, Ser: 0.8 mg/dL (ref 0.44–1.00)
GFR, Estimated: >60 mL/min (ref >60)
Glucose, Bld: 93 mg/dL (ref 70–99)
Potassium: 4.8 mmol/L (ref 3.5–5.1)
Sodium: 134 mmol/L — ABNORMAL LOW (ref 135–145)
Total Bilirubin: 0.4 mg/dL (ref 0.3–1.2)
Total Protein: 4.1 g/dL — ABNORMAL LOW (ref 6.5–8.1)

## 2020-03-07 LAB — PROTEIN / CREATININE RATIO, URINE
Creatinine, Urine: 136.29 mg/dL
Protein Creatinine Ratio: 15.65 mg/mg{Cre} — ABNORMAL HIGH (ref 0.00–0.15)
Total Protein, Urine: 2133 mg/dL

## 2020-03-07 MED ORDER — LABETALOL HCL 200 MG PO TABS
200.0000 mg | ORAL_TABLET | Freq: Two times a day (BID) | ORAL | Status: DC
  Administered 2020-03-07 (×2): 200 mg via ORAL
  Filled 2020-03-07 (×2): qty 1

## 2020-03-07 NOTE — Progress Notes (Signed)
Debbie Petersen 24 y.o. G1P0000 at [redacted]w[redacted]d HD#7 admitted with severe range BP S: Reports feeling fine, denies HA/VC/RUQ pain/SOB. Generalized swelling, notes Left hand > right hand.  No  contractions, LOF, VB. Normal FM O: Patient Vitals for the past 24 hrs:  BP Temp Temp src Pulse Resp SpO2  03/07/20 1157 (!) 158/99 98.6 F (37 C) Oral 77 17 100 %  03/07/20 1010 (!) 154/91 98.8 F (37.1 C) Oral 79 18 99 %  03/07/20 0508 131/78 98.6 F (37 C) Oral 72 18 98 %  03/06/20 2304 (!) 154/97 98.7 F (37.1 C) Oral 79 (!) 21 99 %  03/06/20 2028 140/83 98.4 F (36.9 C) Oral 81 16 98 %  03/06/20 1635 (!) 142/81 98.5 F (36.9 C) Oral 80 18 99 %    Resting comfortably Trace edema on lower and upper extremities. No tender to palpation  Recent Labs    03/06/20 2347 03/07/20 0610  WBC  --  13.2*  HGB  --  12.4  HCT  --  35.4*  PLT  --  176  NA 134*  --   K 4.8  --   CL 106  --   BUN 14  --   CREATININE 0.80  --   AST 22  --   ALT 22  --   BILITOT 0.4  --    UPC 16.65  NST: FHR 130, Reactive. Toco quiet  A/p: Debbie Petersen 24 y.o. G1P0000 at [redacted]w[redacted]d HD#7 admitted with severe range BP, chest pain, now superimposed severe preeclampsia 1. Superimposed severe preeclampsia: patient has likely CHTN (diagnosed by HTN prior to 20w GA, started procardia 30 xL at 24 weeks).  -Now on procardia 90 xL QD (last IV antihypertensive was 12/17 AM). Spoke with Dr. Grace Bushy (MFM), patient Bps mostly 150s/90s in the past 24h, ok to add on labetalol and to max out medication to maintain BP 130-150s/80-100s. If requiring IV antihypertensives then move forward with delivery. Added labetalol 200mg  BID.   -Received mag on admission.  -Twice weekly preeclampsia labs, UPC remains elevated, CBC/CMP WNL.  -MFM consulted. Deliver at 34 weeks. Delivery prior to 34 weeks would be indicated: Should her blood pressures be persistently greater than 160/100 Should she require IV push medication for  blood pressure control Should she complain of any signs and symptoms of severe preeclampsia Should her preeclampsia labs show any abnormalities At any time for nonreassuring fetal status  2. Prematurity  -Received BMZ 12/14-15. May need receive course of BMZ pending delivery timing -Growth 07-09-1973 on 12/7 was EFW 1197 gm (27%, AC 17% -Seen by NICU 12/15  3. Fetal status: -Daily fetal testing -weekly BPP, due 12/23  4. Obesity: prepregnancy BMI 49  5. Discussed with patient breastfeeding/birthing classes  Debbie Petersen 03/07/20 2:35 PM

## 2020-03-07 NOTE — Progress Notes (Signed)
Initial Nutrition Assessment  DOCUMENTATION CODES:   Morbid obesity  INTERVENTION:  Regular Diet Pt may order double protein portions and snacks TID if she makes request when ordering meals   NUTRITION DIAGNOSIS:   Increased nutrient needs related to  (pregancy and fetal growth requirements) as evidenced by  (31 weeks IUP).  GOAL:  Patient will meet greater than or equal to 90% of their needs  MONITOR:  Weight trends  REASON FOR ASSESSMENT:   Antenatal,LOS   ASSESSMENT:  31 3/7 weeks, adm with severe PEC. pre-pregancy weight 263 lbs, BMI 46.9. 37 lb weight gain to date. on PNV   Diet Order:   Diet Order            Diet regular Room service appropriate? Yes; Fluid consistency: Thin  Diet effective now                EDUCATION NEEDS:   No education needs have been identified at this time  Skin:  Skin Assessment: Reviewed RN Assessment  Height:   Ht Readings from Last 1 Encounters:  03/01/20 5\' 3"  (1.6 m)    Weight:   Wt Readings from Last 1 Encounters:  03/01/20 (!) 136.5 kg    Ideal Body Weight:   115 lbs  BMI:  Body mass index is 53.32 kg/m.  Estimated Nutritional Needs:   Kcal:  2500-2700  Protein:  10-115 g  Fluid:  2.7 L    03/03/20 M.M LDN Neonatal Nutrition Support Specialist/RD III

## 2020-03-08 MED ORDER — LABETALOL HCL 200 MG PO TABS: 600.0000 mg | ORAL_TABLET | Freq: Three times a day (TID) | ORAL | Status: DC

## 2020-03-08 MED ORDER — NIFEDIPINE ER OSMOTIC RELEASE 30 MG PO TB24
30.0000 mg | ORAL_TABLET | Freq: Once | ORAL | Status: AC
  Administered 2020-03-08: 30 mg via ORAL
  Filled 2020-03-08: qty 1

## 2020-03-08 MED ORDER — LABETALOL HCL 200 MG PO TABS: 400.0000 mg | ORAL_TABLET | Freq: Two times a day (BID) | ORAL | Status: DC

## 2020-03-08 MED ORDER — LABETALOL HCL 200 MG PO TABS
400.0000 mg | ORAL_TABLET | Freq: Two times a day (BID) | ORAL | Status: DC
  Administered 2020-03-08: 400 mg via ORAL
  Filled 2020-03-08: qty 2

## 2020-03-08 MED ORDER — LABETALOL HCL 200 MG PO TABS
600.0000 mg | ORAL_TABLET | Freq: Three times a day (TID) | ORAL | Status: DC
  Administered 2020-03-08 – 2020-03-11 (×9): 600 mg via ORAL
  Filled 2020-03-08 (×9): qty 3

## 2020-03-08 MED ORDER — LABETALOL HCL 200 MG PO TABS
400.0000 mg | ORAL_TABLET | Freq: Three times a day (TID) | ORAL | Status: DC
  Administered 2020-03-08: 400 mg via ORAL
  Filled 2020-03-08: qty 2

## 2020-03-08 MED ORDER — NIFEDIPINE ER OSMOTIC RELEASE 30 MG PO TB24
60.0000 mg | ORAL_TABLET | Freq: Two times a day (BID) | ORAL | Status: DC
  Administered 2020-03-09 – 2020-03-12 (×7): 60 mg via ORAL
  Filled 2020-03-08 (×7): qty 2

## 2020-03-08 NOTE — Progress Notes (Addendum)
Debbie Petersen 24 y.o. G1P0000 at [redacted]w[redacted]d HD#8 admitted with chronic hypertension with superimposed severe preeclampsia S: Natausha is feeling well this morning with no complaints. Denies headache, vision changes, RUQ pain, shortness of breath, chest pain. Left hand swollen more than right hand, improved from yesterday, without any pain.   No contractions, LOF, VB. FM  O: Vitals:   03/08/20 0422 03/08/20 0526 03/08/20 0530 03/08/20 0837  BP: (!) 165/105 (!) 156/84 (!) 156/84 (!) 144/74  Pulse: 79 82 82 72  Resp: 16   17  Temp: 98.5 F (36.9 C)   98.5 F (36.9 C)  TempSrc: Oral   Oral  SpO2:    100%  Weight:      Height:        Gen: Resting comfortably, no distress Trace edema on lower and upper extremities. Left hand (IV side) more edematous than right hand, without tenderness or erythema.   NST FHR 130bpm, moderate variability, +10x10 accels, no decels Toco: acontractile  A/p: Debbie Petersen 24 y.o. G1P0000 at [redacted]w[redacted]d HD#8 admitted with chronic hypertension with superimposed severe preeclampsia  1. Superimposed severe preeclampsia: patient has likely CHTN (diagnosed by HTN prior to 20w GA, started procardia 30 xL at 24 weeks).  -Now on procardia 90 xL QD (last IV antihypertensive was 12/17 AM).  - Yesterday added on labetalol 200mg  BID and then increased to 400mg  BID this morning. Per Dr. (MFM),  Okay to max out medication to maintain BP 130-150s/80-100s. If requiring IV antihypertensives then move forward with delivery  -Received mag on admission.  -Twice weekly preeclampsia labs, UPC remains elevated, CBC/CMP WNL. Next due Thurs 12/23 -MFM consulted. Deliver at 34 weeks. Delivery prior to 34 weeks would be indicated: Should her blood pressures be persistently greater than 160/100 Should she require IV push medication for blood pressure control Should she complain of any signs and symptoms of severe preeclampsia Should her  preeclampsia labs show any abnormalities At any time for nonreassuring fetal status  2. Prematurity  -Received BMZ 12/14-15. May need repear course of BMZ pending delivery timing.  -Growth 1/24 on 12/7 was EFW 1197 gm (27%, AC 17%) -Seen by NICU 12/15  3. Fetal status: -Daily NST -weekly BPP, due 12/23  4. Obesity: prepregnancy BMI 49  5. Left hand swelling: improved from yesterday, without tenderness/erythema, will continue to monitor. Consider doppler if worsening or develops pain.    1/16 03/08/20 9:53 AM

## 2020-03-08 NOTE — Progress Notes (Addendum)
OBGYN Note Vitals:   03/07/20 1954 03/07/20 2333 03/08/20 0422 03/08/20 0526  BP: (!) 156/90 (!) 154/102 (!) 165/105 (!) 156/84  Pulse: 84 73 79 82  Resp: 18 18 16    Temp: 98.5 F (36.9 C) 98.4 F (36.9 C) 98.5 F (36.9 C)   TempSrc: Oral Oral Oral   SpO2: 99% 100%    Weight:      Height:       24 y.o.Debbie ScotG1P0000 with superimposed preeclampsia Overnight 1 severe range BP, did not receive IV medication, now mild range. Remains 150s/80-100s, increased labetalol to 400mg  BID, give dose now, cont procardia 90xl QD  Debbie Petersen 03/08/20 5:32 AM

## 2020-03-08 NOTE — Progress Notes (Signed)
Interval Event Note  BP 167/102, repeat 15 mins later 179/101. Labetalol 20mg  IVP was administered and repeat BP 157/99. Labetalol 400mg  PO given.  Patient seen and assessed at bedside. Debbie Petersen is feeling well with no complaints. No HA, vision changes, RUQ pain, chest pain, SOB. Good fetal movement. No contractions, VB, LOF.  I spoke with Dr. (MFM). He advised to continuing maximizing PO antihypertensive control and does not recommend delivery at this time. Recommended increasing labetalol to 600mg  q8h and Procardia XL to 60mg  q 12hrs. Goal Bps 130-150/80-100s.   Patient made aware and comfortable with this plan.   Jon Gills, MD 03/08/20 2:38 PM

## 2020-03-09 NOTE — Progress Notes (Signed)
Debbie Petersen 24 y.o. G1P0000 at [redacted]w[redacted]d HD#9 admitted with chronic hypertension with superimposed severe preeclampsia S: Debbie Petersen is feeling well this morning with no complaints. Denies headache, vision changes, RUQ pain, shortness of breath, chest pain. Left hand swollen more than right hand, improved from yesterday, without any pain.   No contractions, LOF, VB. FM  O: Vitals:   03/08/20 2000 03/09/20 0011 03/09/20 0415 03/09/20 0736  BP: (!) 145/86 140/80 (!) 151/84 (!) 134/93  Pulse: 78 74 70 80  Resp: 17 17 18 18   Temp: 98.5 F (36.9 C) 98.4 F (36.9 C) 98.4 F (36.9 C) 98.4 F (36.9 C)  TempSrc: Oral Oral Oral Oral  SpO2: 99% 100% 100% 99%  Weight:      Height:        Gen: Resting comfortably, no distress Trace edema on lower and upper extremities. Left hand (IV side) more edematous than right hand, without tenderness or erythema.   NST FHR 130bpm, moderate variability, +10x10 accels, no decels Toco: acontractile  A/p: 24 y.o. G1P0000 at [redacted]w[redacted]d HD#9 admitted with chronic hypertension with superimposed severe preeclampsia  1. Superimposed severe preeclampsia: patient has likely CHTN (diagnosed by HTN prior to 20w GA, started procardia 30 xL at 24 weeks).  -Now on procardia 60 xL BID (last IV antihypertensive was 12/21).  - Yesterday labetalol increased to 600mg  TID with plan to max out po antihypertensives per Dr. 1/22 (MFM) to maintain mild range BPs. If requiring IV antihypertensives then move forward with delivery  -Received mag on admission.  -Twice weekly preeclampsia labs, UPC remains elevated, CBC/CMP WNL. Next due Thurs 12/23 -MFM consulted. Deliver at 34 weeks. Delivery prior to 34 weeks would be indicated: Should her blood pressures be persistently greater than 160/100 Should she require IV push medication for blood pressure control Should she complain of any signs and symptoms of severe  preeclampsia Should her preeclampsia labs show any abnormalities At any time for nonreassuring fetal status  2. Prematurity  -Received BMZ 12/14-15. May need repear course of BMZ pending delivery timing.  -Growth 1/24 on 12/7 was EFW 1197 gm (27%, AC 17%) -Seen by NICU 12/15  3. Fetal status: -Daily NST -weekly BPP, due 12/23  4. Obesity: prepregnancy BMI 49  5. Left hand swelling: improved from yesterday, without tenderness/erythema, will continue to monitor. Consider doppler if worsening or develops pain.    1/16 03/09/20 10:14 AM

## 2020-03-10 ENCOUNTER — Inpatient Hospital Stay (HOSPITAL_COMMUNITY): Payer: Managed Care, Other (non HMO)

## 2020-03-10 ENCOUNTER — Inpatient Hospital Stay (HOSPITAL_COMMUNITY): Payer: Managed Care, Other (non HMO) | Admitting: Anesthesiology

## 2020-03-10 ENCOUNTER — Encounter (HOSPITAL_COMMUNITY): Payer: Self-pay | Admitting: Obstetrics

## 2020-03-10 ENCOUNTER — Encounter (HOSPITAL_COMMUNITY)
Admission: AD | Disposition: A | Discharge status: Home / Self Care | Payer: Self-pay | Source: Home / Self Care | Attending: Obstetrics and Gynecology

## 2020-03-10 DIAGNOSIS — O99213 Obesity complicating pregnancy, third trimester: Secondary | ICD-10-CM

## 2020-03-10 DIAGNOSIS — Z3A31 31 weeks gestation of pregnancy: Secondary | ICD-10-CM

## 2020-03-10 DIAGNOSIS — O289 Unspecified abnormal findings on antenatal screening of mother: Secondary | ICD-10-CM

## 2020-03-10 DIAGNOSIS — O1413 Severe pre-eclampsia, third trimester: Secondary | ICD-10-CM

## 2020-03-10 DIAGNOSIS — E669 Obesity, unspecified: Secondary | ICD-10-CM

## 2020-03-10 DIAGNOSIS — O3663X0 Maternal care for excessive fetal growth, third trimester, not applicable or unspecified: Secondary | ICD-10-CM

## 2020-03-10 LAB — CBC WITH DIFFERENTIAL/PLATELET
Abs Immature Granulocytes: 0.04 10*3/uL (ref 0.00–0.07)
Basophils Absolute: 0 10*3/uL (ref 0.0–0.1)
Basophils Relative: 0 %
Eosinophils Absolute: 0 10*3/uL (ref 0.0–0.5)
Eosinophils Relative: 0 %
HCT: 36.4 % (ref 36.0–46.0)
Hemoglobin: 13 g/dL (ref 12.0–15.0)
Immature Granulocytes: 0 %
Lymphocytes Relative: 28 %
Lymphs Abs: 3.6 10*3/uL (ref 0.7–4.0)
MCH: 29.8 pg (ref 26.0–34.0)
MCHC: 35.7 g/dL (ref 30.0–36.0)
MCV: 83.5 fL (ref 80.0–100.0)
Monocytes Absolute: 1.1 10*3/uL — ABNORMAL HIGH (ref 0.1–1.0)
Monocytes Relative: 8 %
Neutro Abs: 8 10*3/uL — ABNORMAL HIGH (ref 1.7–7.7)
Neutrophils Relative %: 64 %
Platelets: 202 10*3/uL (ref 150–400)
RBC: 4.36 MIL/uL (ref 3.87–5.11)
RDW: 12.9 % (ref 11.5–15.5)
WBC: 12.7 10*3/uL — ABNORMAL HIGH (ref 4.0–10.5)
nRBC: 0 % (ref 0.0–0.2)

## 2020-03-10 LAB — CULTURE, BETA STREP (GROUP B ONLY)

## 2020-03-10 LAB — TYPE AND SCREEN
ABO/RH(D): B POS
Antibody Screen: NEGATIVE

## 2020-03-10 SURGERY — Surgical Case
Anesthesia: Spinal

## 2020-03-10 MED ORDER — MORPHINE SULFATE (PF) 0.5 MG/ML IJ SOLN
INTRAMUSCULAR | Status: AC
  Filled 2020-03-10: qty 10

## 2020-03-10 MED ORDER — ONDANSETRON HCL 4 MG/2ML IJ SOLN
INTRAMUSCULAR | Status: AC
  Filled 2020-03-10: qty 2

## 2020-03-10 MED ORDER — OXYTOCIN-SODIUM CHLORIDE 30-0.9 UT/500ML-% IV SOLN
INTRAVENOUS | Status: DC | PRN
  Administered 2020-03-10: 300 mL via INTRAVENOUS

## 2020-03-10 MED ORDER — PHENYLEPHRINE 40 MCG/ML (10ML) SYRINGE FOR IV PUSH (FOR BLOOD PRESSURE SUPPORT)
PREFILLED_SYRINGE | INTRAVENOUS | Status: AC
  Filled 2020-03-10: qty 10

## 2020-03-10 MED ORDER — MORPHINE SULFATE (PF) 0.5 MG/ML IJ SOLN
INTRAMUSCULAR | Status: DC | PRN
  Administered 2020-03-10: .15 mg via INTRATHECAL

## 2020-03-10 MED ORDER — DEXTROSE 5 % IV SOLN
INTRAVENOUS | Status: DC | PRN
  Administered 2020-03-10: 3 g via INTRAVENOUS

## 2020-03-10 MED ORDER — OXYTOCIN-SODIUM CHLORIDE 30-0.9 UT/500ML-% IV SOLN
INTRAVENOUS | Status: AC
  Filled 2020-03-10: qty 500

## 2020-03-10 MED ORDER — FENTANYL CITRATE (PF) 100 MCG/2ML IJ SOLN
INTRAMUSCULAR | Status: DC | PRN
  Administered 2020-03-10: 15 ug via INTRATHECAL

## 2020-03-10 MED ORDER — PHENYLEPHRINE HCL-NACL 20-0.9 MG/250ML-% IV SOLN
INTRAVENOUS | Status: AC
  Filled 2020-03-10: qty 250

## 2020-03-10 MED ORDER — BUPIVACAINE IN DEXTROSE 0.75-8.25 % IT SOLN
INTRATHECAL | Status: DC | PRN
  Administered 2020-03-10: 1.6 mL via INTRATHECAL

## 2020-03-10 MED ORDER — PHENYLEPHRINE HCL (PRESSORS) 10 MG/ML IV SOLN
INTRAVENOUS | Status: DC | PRN
  Administered 2020-03-10: 120 ug via INTRAVENOUS
  Administered 2020-03-10: 80 ug via INTRAVENOUS

## 2020-03-10 MED ORDER — SOD CITRATE-CITRIC ACID 500-334 MG/5ML PO SOLN
ORAL | Status: AC
  Administered 2020-03-10: 30 mL
  Filled 2020-03-10: qty 15

## 2020-03-10 MED ORDER — ONDANSETRON HCL 4 MG/2ML IJ SOLN
INTRAMUSCULAR | Status: DC | PRN
  Administered 2020-03-10: 4 mg via INTRAVENOUS

## 2020-03-10 MED ORDER — DEXAMETHASONE SODIUM PHOSPHATE 10 MG/ML IJ SOLN
INTRAMUSCULAR | Status: DC | PRN
  Administered 2020-03-10: 10 mg via INTRAVENOUS

## 2020-03-10 MED ORDER — DEXAMETHASONE SODIUM PHOSPHATE 10 MG/ML IJ SOLN
INTRAMUSCULAR | Status: AC
  Filled 2020-03-10: qty 1

## 2020-03-10 MED ORDER — FENTANYL CITRATE (PF) 100 MCG/2ML IJ SOLN
INTRAMUSCULAR | Status: AC
  Filled 2020-03-10: qty 2

## 2020-03-10 MED ORDER — PHENYLEPHRINE HCL-NACL 20-0.9 MG/250ML-% IV SOLN
INTRAVENOUS | Status: DC | PRN
  Administered 2020-03-10: 60 ug/min via INTRAVENOUS

## 2020-03-10 MED ORDER — DEXTROSE 5 % IV SOLN
INTRAVENOUS | Status: AC
  Filled 2020-03-10: qty 3000

## 2020-03-10 SURGICAL SUPPLY — 39 items
CHLORAPREP W/TINT 26ML (MISCELLANEOUS) ×2 IMPLANT
CLAMP CORD UMBIL (MISCELLANEOUS) IMPLANT
CLOTH BEACON ORANGE TIMEOUT ST (SAFETY) ×2 IMPLANT
DRESSING PREVENA PLUS CUSTOM (GAUZE/BANDAGES/DRESSINGS) ×1 IMPLANT
DRSG OPSITE POSTOP 4X10 (GAUZE/BANDAGES/DRESSINGS) ×2 IMPLANT
DRSG PREVENA PLUS CUSTOM (GAUZE/BANDAGES/DRESSINGS) ×2
ELECT REM PT RETURN 9FT ADLT (ELECTROSURGICAL) ×2
ELECTRODE REM PT RTRN 9FT ADLT (ELECTROSURGICAL) ×1 IMPLANT
EXTRACTOR VACUUM M CUP 4 TUBE (SUCTIONS) IMPLANT
GLOVE BIOGEL PI IND STRL 6.5 (GLOVE) ×1 IMPLANT
GLOVE BIOGEL PI IND STRL 7.0 (GLOVE) ×1 IMPLANT
GLOVE BIOGEL PI INDICATOR 6.5 (GLOVE) ×1
GLOVE BIOGEL PI INDICATOR 7.0 (GLOVE) ×1
GLOVE ECLIPSE 6.5 STRL STRAW (GLOVE) ×2 IMPLANT
GOWN STRL REUS W/TWL LRG LVL3 (GOWN DISPOSABLE) ×4 IMPLANT
HEMOSTAT ARISTA ABSORB 3G PWDR (HEMOSTASIS) ×2 IMPLANT
KIT ABG SYR 3ML LUER SLIP (SYRINGE) IMPLANT
MAT PREVALON FULL STRYKER (MISCELLANEOUS) ×2 IMPLANT
NEEDLE HYPO 25X5/8 SAFETYGLIDE (NEEDLE) IMPLANT
NS IRRIG 1000ML POUR BTL (IV SOLUTION) ×2 IMPLANT
PACK C SECTION WH (CUSTOM PROCEDURE TRAY) ×2 IMPLANT
PAD ABD 7.5X8 STRL (GAUZE/BANDAGES/DRESSINGS) IMPLANT
PAD OB MATERNITY 4.3X12.25 (PERSONAL CARE ITEMS) ×2 IMPLANT
PENCIL SMOKE EVAC W/HOLSTER (ELECTROSURGICAL) ×2 IMPLANT
RETAINER VISCERAL (MISCELLANEOUS) ×2 IMPLANT
RETRACTOR TRAXI PANNICULUS (MISCELLANEOUS) ×1 IMPLANT
RTRCTR C-SECT PINK 25CM LRG (MISCELLANEOUS) ×2 IMPLANT
SUT MNCRL 0 VIOLET CTX 36 (SUTURE) ×1 IMPLANT
SUT MON AB 2-0 CT1 27 (SUTURE) ×2 IMPLANT
SUT MONOCRYL 0 CTX 36 (SUTURE) ×1
SUT PDS AB 0 CTX 60 (SUTURE) IMPLANT
SUT PLAIN 2 0 XLH (SUTURE) IMPLANT
SUT VIC AB 0 CTX 36 (SUTURE) ×4
SUT VIC AB 0 CTX36XBRD ANBCTRL (SUTURE) ×4 IMPLANT
SUT VIC AB 4-0 KS 27 (SUTURE) ×2 IMPLANT
TOWEL OR 17X24 6PK STRL BLUE (TOWEL DISPOSABLE) ×2 IMPLANT
TRAXI PANNICULUS RETRACTOR (MISCELLANEOUS) ×1
TRAY FOLEY W/BAG SLVR 14FR LF (SET/KITS/TRAYS/PACK) ×2 IMPLANT
WATER STERILE IRR 1000ML POUR (IV SOLUTION) ×2 IMPLANT

## 2020-03-10 NOTE — Anesthesia Procedure Notes (Signed)
Spinal  Patient location during procedure: OR Start time: 03/10/2020 10:55 PM End time: 03/10/2020 11:01 PM Staffing Performed: anesthesiologist  Anesthesiologist: Beryle Lathe, MD Preanesthetic Checklist Completed: patient identified, IV checked, risks and benefits discussed, surgical consent, monitors and equipment checked, pre-op evaluation and timeout performed Spinal Block Patient position: sitting Prep: DuraPrep Patient monitoring: heart rate, cardiac monitor, continuous pulse ox and blood pressure Approach: midline Location: L3-4 Injection technique: single-shot Needle Needle type: Quincke  Needle gauge: 22 G Needle length: 12.7 cm Additional Notes Consent was obtained prior to the procedure with all questions answered and concerns addressed. Risks including, but not limited to, bleeding, infection, nerve damage, paralysis, failed block, inadequate analgesia, allergic reaction, high spinal, itching, and headache were discussed and the patient wished to proceed. Functioning IV was confirmed and monitors were applied. Sterile prep and drape, including hand hygiene, mask, and sterile gloves were used. The patient was positioned and the spine was prepped. The skin was anesthetized with lidocaine. Free flow of clear CSF was obtained prior to injecting local anesthetic into the CSF. The spinal needle aspirated freely following injection. The needle was carefully withdrawn. The patient tolerated the procedure well.   Leslye Peer, MD

## 2020-03-10 NOTE — Progress Notes (Signed)
Vitals:   03/10/20 0613 03/10/20 0748 03/10/20 1122 03/10/20 1124  BP:  137/83 (!) 159/98 (!) 152/94  Pulse: 76 71 63 68  Resp:  18 19 18   Temp:  98.4 F (36.9 C) 98.9 F (37.2 C) 98.5 F (36.9 C)  TempSrc:  Oral Oral Oral  SpO2:  100% 96% 98%  Weight:      Height:        FHTs today 120s, gSTV, good long term variability but not strictly reactive.  Pt was to have BPP/AFI today but order was not in- order in and will do as soon as can.

## 2020-03-10 NOTE — Anesthesia Preprocedure Evaluation (Signed)
Anesthesia Evaluation  Patient identified by MRN, date of birth, ID band Patient awake    Reviewed: Allergy & Precautions, NPO status , Patient's Chart, lab work & pertinent test results  History of Anesthesia Complications Negative for: history of anesthetic complications  Airway Mallampati: II  TM Distance: >3 FB Neck ROM: Full    Dental  (+) Dental Advisory Given   Pulmonary neg pulmonary ROS,    Pulmonary exam normal        Cardiovascular hypertension, Normal cardiovascular exam     Neuro/Psych negative neurological ROS  negative psych ROS   GI/Hepatic negative GI ROS, Neg liver ROS,   Endo/Other  Morbid obesity  Renal/GU negative Renal ROS     Musculoskeletal negative musculoskeletal ROS (+)   Abdominal (+) + obese,   Peds  Hematology negative hematology ROS (+)  Plt 202k    Anesthesia Other Findings Covid test negative 12/14 (inpatient since)   Reproductive/Obstetrics (+) Pregnancy  PCOS                              Anesthesia Physical Anesthesia Plan  ASA: III  Anesthesia Plan: Spinal   Post-op Pain Management:    Induction:   PONV Risk Score and Plan: 2 and Treatment may vary due to age or medical condition, Ondansetron and Scopolamine patch - Pre-op  Airway Management Planned: Natural Airway  Additional Equipment: None  Intra-op Plan:   Post-operative Plan:   Informed Consent: I have reviewed the patients History and Physical, chart, labs and discussed the procedure including the risks, benefits and alternatives for the proposed anesthesia with the patient or authorized representative who has indicated his/her understanding and acceptance.       Plan Discussed with: CRNA and Anesthesiologist  Anesthesia Plan Comments: (Labs reviewed, platelets acceptable. Discussed risks and benefits of spinal, including spinal/epidural hematoma, infection, failed block,  and PDPH. Patient expressed understanding and wished to proceed. )        Anesthesia Quick Evaluation

## 2020-03-10 NOTE — Progress Notes (Signed)
FHTs 120s, gSTV, gLTV, 10x10s; occ mild variables Toco occ  Continue monitoring for now.

## 2020-03-10 NOTE — Progress Notes (Signed)
Pt arrived from [facility] specialty care at 2208. FHR monitors applied and assessing. Pt normotensive on arrival, with no complaints of ctx or s/s of preE. Dr Henderson Cloud at bedside, discussing plan of care with patient. Decision made for Primary Cesearan section for NRFHT. Pt understands, MD discussed risk and benefits with pt and Debbie Petersen expresses no questions or concerns at this time. Consents signed and verified. RN initiated C/S prep. Jewelry removed, CBC obtained (plt 202). FHR revealed baseline of 135, moderate variability, no accels with decels and no Ctx noted at this time. Pt transported to [facility]".

## 2020-03-10 NOTE — Progress Notes (Addendum)
24 y.o. G1P0000 [redacted]w[redacted]d HD#10 admitted for Severe preeclampsia, third trimester [O14.13].  Pt currently stable with no c/o or symptoms of severe preeclampsia.  Good FM.  Vitals:   03/09/20 2041 03/09/20 2147 03/10/20 0416 03/10/20 0613  BP: (!) 141/77  139/84   Pulse: 77 80 79 76  Resp: 17  16   Temp: 98.5 F (36.9 C)  98.6 F (37 C)   TempSrc: Oral  Oral   SpO2: 99%  98%   Weight:      Height:        Lungs CTA Cor RRR Abd  Soft, gravid, nontender Ex SCDs FHTs last night 120s, good short term variability, 10x10 accels Toco  rare  GBS neg.   A/p: Eldridge Scot 24 y.o. G1P0000 at [redacted]w[redacted]d HD#10 admitted with chronic hypertension with superimposed severe preeclampsia  1. Superimposed severe preeclampsia: patient has likely CHTN (diagnosed by HTN prior to 20w GA, started procardia 30 xL at 24 weeks).  -Now on procardia 60 xL BID (last IV antihypertensive was 12/21).  - Yesterday labetalol increased to 600mg  TID with plan to max out po antihypertensives per Dr. (MFM) to maintain mild range BPs. If requiring IV antihypertensives then move forward with delivery -Received mag on admission.  -Twice weekly preeclampsia labs, UPC remains elevated, CBC/CMP WNL. Next due Thurs 12/23 -MFM consulted. Deliver at 34 weeks. Delivery prior to 34 weeks would be indicated: Should her blood pressures be persistently greater than 160/100 Should she require IV push medication for blood pressure control Should she complain of any signs and symptoms of severe preeclampsia Should her preeclampsia labs show any abnormalities At any time for nonreassuring fetal status  2. Prematurity  -Received BMZ 12/14-15. May need repear course of BMZ pending delivery timing.  -Growth 07-09-1973 on 12/7 was EFW 1197 gm (27%, AC 17%) -Seen by NICU 12/15  3. Fetal status: -Daily NST -weekly BPP, due today.  4. Obesity: prepregnancy BMI 49  5. Left  hand swelling: improved from 2 days ago, without tenderness/erythema, will continue to monitor. Consider doppler if worsening or develops pain.      1/16

## 2020-03-10 NOTE — Progress Notes (Signed)
Minimal variability noted on EFM this afternoon.  Phoned Dr. Henderson Cloud with update and to ask about BPP for today mentioned in provider notes.  See order for BPP and AFI.

## 2020-03-10 NOTE — Progress Notes (Signed)
Pt is comfortable without c/o contractions or severe preeclampsia sx.  Vitals:   03/10/20 1630 03/10/20 1635 03/10/20 1636 03/10/20 1948  BP: (!) 158/87  (!) 158/87 (!) 147/89  Pulse: 65  67 63  Resp: 20   20  Temp: 98.7 F (37.1 C)   98.4 F (36.9 C)  TempSrc: Oral   Oral  SpO2: 100% 98% 98% 100%  Weight:      Height:        FHTs 130s, gSTV, no accels; now repetitive moderate variable decels - pt having regularly despite no recorded contractions.   Toco none seen SVE deferred.   A/P D/w pt that baby is now showing signs of distress with FHTs and per Dr. Judeth Cornfield, we should proceed with delivery for that.  Earlier today BPP was 8/8 but dopplers were with some absent flow.  Baby is IUGR now and measuring <1%ile.  Given that baby does not have reassuring FHTs and a persistent cat 2 tracing in the face of above, I recommend we proceed with C/S now.  Baby is unlikely to tolerate long labor.  Pt voiced understanding and agrees to proceed.  Risks, benefits d/w pt.  Will consider magnesium sulfate after delivery if BPs remain close to severe range.

## 2020-03-11 ENCOUNTER — Encounter (HOSPITAL_COMMUNITY): Payer: Self-pay | Admitting: Obstetrics

## 2020-03-11 LAB — CBC
HCT: 33.2 % — ABNORMAL LOW (ref 36.0–46.0)
Hemoglobin: 11.7 g/dL — ABNORMAL LOW (ref 12.0–15.0)
MCH: 30.1 pg (ref 26.0–34.0)
MCHC: 35.2 g/dL (ref 30.0–36.0)
MCV: 85.3 fL (ref 80.0–100.0)
Platelets: 184 10*3/uL (ref 150–400)
RBC: 3.89 MIL/uL (ref 3.87–5.11)
RDW: 13 % (ref 11.5–15.5)
WBC: 21.6 10*3/uL — ABNORMAL HIGH (ref 4.0–10.5)
nRBC: 0 % (ref 0.0–0.2)

## 2020-03-11 MED ORDER — MAGNESIUM SULFATE 40 GM/1000ML IV SOLN
2.0000 g/h | INTRAVENOUS | Status: AC
  Administered 2020-03-11 (×2): 2 g/h via INTRAVENOUS
  Filled 2020-03-11: qty 1000

## 2020-03-11 MED ORDER — LABETALOL HCL 5 MG/ML IV SOLN
40.0000 mg | INTRAVENOUS | Status: DC | PRN
  Administered 2020-03-11: 40 mg via INTRAVENOUS

## 2020-03-11 MED ORDER — NALBUPHINE HCL 10 MG/ML IJ SOLN: 5.0000 mg | INTRAMUSCULAR | Status: DC | PRN

## 2020-03-11 MED ORDER — OXYTOCIN-SODIUM CHLORIDE 30-0.9 UT/500ML-% IV SOLN
2.5000 [IU]/h | INTRAVENOUS | Status: AC
  Administered 2020-03-11: 2.5 [IU]/h via INTRAVENOUS

## 2020-03-11 MED ORDER — FLEET ENEMA 7-19 GM/118ML RE ENEM: 1.0000 | ENEMA | Freq: Every day | RECTAL | Status: DC | PRN

## 2020-03-11 MED ORDER — LACTATED RINGERS IV SOLN: INTRAVENOUS | Status: DC

## 2020-03-11 MED ORDER — MEASLES, MUMPS & RUBELLA VAC IJ SOLR: 0.5000 mL | Freq: Once | INTRAMUSCULAR | Status: DC

## 2020-03-11 MED ORDER — LABETALOL HCL 5 MG/ML IV SOLN
INTRAVENOUS | Status: AC
  Filled 2020-03-11: qty 4

## 2020-03-11 MED ORDER — CHLORHEXIDINE GLUCONATE 0.12 % MT SOLN
15.0000 mL | Freq: Once | OROMUCOSAL | Status: DC
  Filled 2020-03-11: qty 15

## 2020-03-11 MED ORDER — METHYLERGONOVINE MALEATE 0.2 MG PO TABS
0.2000 mg | ORAL_TABLET | ORAL | Status: DC | PRN
Start: 2020-03-11 — End: 2020-03-13

## 2020-03-11 MED ORDER — MAGNESIUM SULFATE BOLUS VIA INFUSION
4.0000 g | Freq: Once | INTRAVENOUS | Status: AC
  Administered 2020-03-11: 4 g via INTRAVENOUS
  Filled 2020-03-11: qty 1000

## 2020-03-11 MED ORDER — MENTHOL 3 MG MT LOZG: 1.0000 | LOZENGE | OROMUCOSAL | Status: DC | PRN

## 2020-03-11 MED ORDER — LABETALOL HCL 200 MG PO TABS
800.0000 mg | ORAL_TABLET | Freq: Three times a day (TID) | ORAL | Status: DC
  Administered 2020-03-12 – 2020-03-13 (×5): 800 mg via ORAL
  Filled 2020-03-11 (×5): qty 4

## 2020-03-11 MED ORDER — WITCH HAZEL-GLYCERIN EX PADS: 1.0000 "application " | MEDICATED_PAD | CUTANEOUS | Status: DC | PRN

## 2020-03-11 MED ORDER — FAMOTIDINE 20 MG PO TABS: 20.0000 mg | ORAL_TABLET | Freq: Once | ORAL | Status: DC

## 2020-03-11 MED ORDER — SIMETHICONE 80 MG PO CHEW
80.0000 mg | CHEWABLE_TABLET | Freq: Three times a day (TID) | ORAL | Status: DC
  Administered 2020-03-11 – 2020-03-13 (×7): 80 mg via ORAL
  Filled 2020-03-11 (×8): qty 1

## 2020-03-11 MED ORDER — DIBUCAINE (PERIANAL) 1 % EX OINT: 1.0000 "application " | TOPICAL_OINTMENT | CUTANEOUS | Status: DC | PRN

## 2020-03-11 MED ORDER — OXYCODONE HCL 5 MG/5ML PO SOLN
5.0000 mg | Freq: Once | ORAL | Status: DC | PRN
Start: 2020-03-11 — End: 2020-03-11

## 2020-03-11 MED ORDER — SODIUM CHLORIDE 0.9 % IR SOLN
Status: DC | PRN
  Administered 2020-03-10: 1000 mL

## 2020-03-11 MED ORDER — DIPHENHYDRAMINE HCL 25 MG PO CAPS: 25.0000 mg | ORAL_CAPSULE | Freq: Four times a day (QID) | ORAL | Status: DC | PRN

## 2020-03-11 MED ORDER — LABETALOL HCL 5 MG/ML IV SOLN
20.0000 mg | INTRAVENOUS | Status: DC | PRN
  Administered 2020-03-11 – 2020-03-13 (×2): 20 mg via INTRAVENOUS
  Filled 2020-03-11: qty 4

## 2020-03-11 MED ORDER — SODIUM CHLORIDE 0.9% FLUSH
3.0000 mL | INTRAVENOUS | Status: DC | PRN
  Administered 2020-03-13: 3 mL via INTRAVENOUS

## 2020-03-11 MED ORDER — MEPERIDINE HCL 25 MG/ML IJ SOLN
6.2500 mg | INTRAMUSCULAR | Status: DC | PRN
Start: 2020-03-11 — End: 2020-03-11

## 2020-03-11 MED ORDER — PROMETHAZINE HCL 25 MG/ML IJ SOLN: 6.2500 mg | INTRAMUSCULAR | Status: DC | PRN

## 2020-03-11 MED ORDER — HYDRALAZINE HCL 20 MG/ML IJ SOLN
10.0000 mg | INTRAMUSCULAR | Status: DC | PRN
Start: 2020-03-11 — End: 2020-03-13
  Filled 2020-03-11: qty 1

## 2020-03-11 MED ORDER — FERROUS SULFATE 325 (65 FE) MG PO TABS
325.0000 mg | ORAL_TABLET | Freq: Two times a day (BID) | ORAL | Status: DC
  Administered 2020-03-11 – 2020-03-13 (×5): 325 mg via ORAL
  Filled 2020-03-11 (×5): qty 1

## 2020-03-11 MED ORDER — SCOPOLAMINE 1 MG/3DAYS TD PT72: 1.0000 | MEDICATED_PATCH | Freq: Once | TRANSDERMAL | Status: DC

## 2020-03-11 MED ORDER — LABETALOL HCL 5 MG/ML IV SOLN
INTRAVENOUS | Status: AC
  Filled 2020-03-11: qty 8

## 2020-03-11 MED ORDER — COCONUT OIL OIL: 1.0000 "application " | TOPICAL_OIL | Status: DC | PRN

## 2020-03-11 MED ORDER — NALBUPHINE HCL 10 MG/ML IJ SOLN: 5.0000 mg | Freq: Once | INTRAMUSCULAR | Status: DC | PRN

## 2020-03-11 MED ORDER — FENTANYL CITRATE (PF) 100 MCG/2ML IJ SOLN: 25.0000 ug | INTRAMUSCULAR | Status: DC | PRN

## 2020-03-11 MED ORDER — METHYLERGONOVINE MALEATE 0.2 MG/ML IJ SOLN: 0.2000 mg | INTRAMUSCULAR | Status: DC | PRN

## 2020-03-11 MED ORDER — DIPHENHYDRAMINE HCL 25 MG PO CAPS: 25.0000 mg | ORAL_CAPSULE | ORAL | Status: DC | PRN

## 2020-03-11 MED ORDER — ORAL CARE MOUTH RINSE: 15.0000 mL | Freq: Once | OROMUCOSAL | Status: DC

## 2020-03-11 MED ORDER — KETOROLAC TROMETHAMINE 30 MG/ML IJ SOLN
30.0000 mg | Freq: Four times a day (QID) | INTRAMUSCULAR | Status: AC | PRN
  Administered 2020-03-11: 30 mg via INTRAMUSCULAR

## 2020-03-11 MED ORDER — NALOXONE HCL 4 MG/10ML IJ SOLN
1.0000 ug/kg/h | INTRAVENOUS | Status: DC | PRN
  Filled 2020-03-11: qty 5

## 2020-03-11 MED ORDER — SENNOSIDES-DOCUSATE SODIUM 8.6-50 MG PO TABS
2.0000 | ORAL_TABLET | ORAL | Status: DC
  Administered 2020-03-11 – 2020-03-13 (×3): 2 via ORAL
  Filled 2020-03-11 (×3): qty 2

## 2020-03-11 MED ORDER — BISACODYL 10 MG RE SUPP: 10.0000 mg | Freq: Every day | RECTAL | Status: DC | PRN

## 2020-03-11 MED ORDER — SOD CITRATE-CITRIC ACID 500-334 MG/5ML PO SOLN: 30.0000 mL | Freq: Once | ORAL | Status: DC

## 2020-03-11 MED ORDER — KETOROLAC TROMETHAMINE 30 MG/ML IJ SOLN
INTRAMUSCULAR | Status: AC
  Filled 2020-03-11: qty 1

## 2020-03-11 MED ORDER — ZOLPIDEM TARTRATE 5 MG PO TABS: 5.0000 mg | ORAL_TABLET | Freq: Every evening | ORAL | Status: DC | PRN

## 2020-03-11 MED ORDER — IBUPROFEN 800 MG PO TABS
800.0000 mg | ORAL_TABLET | Freq: Three times a day (TID) | ORAL | Status: DC
  Administered 2020-03-11 – 2020-03-13 (×7): 800 mg via ORAL
  Filled 2020-03-11 (×7): qty 1

## 2020-03-11 MED ORDER — PRENATAL MULTIVITAMIN CH
1.0000 | ORAL_TABLET | Freq: Every day | ORAL | Status: DC
  Administered 2020-03-11 – 2020-03-13 (×3): 1 via ORAL
  Filled 2020-03-11 (×3): qty 1

## 2020-03-11 MED ORDER — DIPHENHYDRAMINE HCL 50 MG/ML IJ SOLN: 12.5000 mg | INTRAMUSCULAR | Status: DC | PRN

## 2020-03-11 MED ORDER — LABETALOL HCL 5 MG/ML IV SOLN
80.0000 mg | INTRAVENOUS | Status: DC | PRN
  Administered 2020-03-11: 80 mg via INTRAVENOUS
  Filled 2020-03-11: qty 16

## 2020-03-11 MED ORDER — OXYCODONE HCL 5 MG PO TABS: 5.0000 mg | ORAL_TABLET | Freq: Once | ORAL | Status: DC | PRN

## 2020-03-11 MED ORDER — MAGNESIUM SULFATE 40 GM/1000ML IV SOLN
INTRAVENOUS | Status: AC
  Filled 2020-03-11: qty 1000

## 2020-03-11 MED ORDER — STERILE WATER FOR IRRIGATION IR SOLN
Status: DC | PRN
  Administered 2020-03-11: 1000 mL

## 2020-03-11 MED ORDER — ONDANSETRON HCL 4 MG/2ML IJ SOLN: 4.0000 mg | Freq: Three times a day (TID) | INTRAMUSCULAR | Status: DC | PRN

## 2020-03-11 MED ORDER — SIMETHICONE 80 MG PO CHEW: 80.0000 mg | CHEWABLE_TABLET | ORAL | Status: DC | PRN

## 2020-03-11 MED ORDER — NALOXONE HCL 0.4 MG/ML IJ SOLN: 0.4000 mg | INTRAMUSCULAR | Status: DC | PRN

## 2020-03-11 MED ORDER — OXYCODONE-ACETAMINOPHEN 5-325 MG PO TABS
1.0000 | ORAL_TABLET | ORAL | Status: DC | PRN
Start: 2020-03-11 — End: 2020-03-13
  Administered 2020-03-12: 1 via ORAL
  Filled 2020-03-11: qty 1

## 2020-03-11 MED ORDER — KETOROLAC TROMETHAMINE 30 MG/ML IJ SOLN: 30.0000 mg | Freq: Four times a day (QID) | INTRAMUSCULAR | Status: AC | PRN

## 2020-03-11 MED ORDER — TETANUS-DIPHTH-ACELL PERTUSSIS 5-2.5-18.5 LF-MCG/0.5 IM SUSY: 0.5000 mL | PREFILLED_SYRINGE | Freq: Once | INTRAMUSCULAR | Status: DC

## 2020-03-11 NOTE — Op Note (Signed)
03/10/2020 - 03/11/2020  12:30 AM  PATIENT:  Debbie Petersen  24 y.o. female  PRE-OPERATIVE DIAGNOSIS:  non reassuring fetal heart tones  POST-OPERATIVE DIAGNOSIS:  non reassuring fetal heart tones  PROCEDURE:  Procedure(s): CESAREAN SECTION (N/A) low transverse  SURGEON:  Surgeon(s) and Role:    Carrington Clamp, MD - Primary   ANESTHESIA:   spinal  EBL:  See anesthesia, not excessive   DRAINS: provena wound vac   LOCAL MEDICATIONS USED:  NONE  SPECIMEN:  Source of Specimen:  placenta  DISPOSITION OF SPECIMEN:  PATHOLOGY  COUNTS:  YES  TOURNIQUET:  * No tourniquets in log *  DICTATION: .Note written in EPIC  PLAN OF CARE: Admit to inpatient   PATIENT DISPOSITION:  PACU - hemodynamically stable.   Delay start of Pharmacological VTE agent (>24hrs) due to surgical blood loss or risk of bleeding: not applicable  Complications:  none Medications:  Ancef, Pitocin Findings:  Baby female, Apgars 7,8, weight P.   Normal tubes, ovaries and uterus seen.  Baby was transferred to NICU on room air for prematurity. Technique:  After adequate spinal anesthesia was achieved, the patient was prepped and draped in usual sterile fashion.  A foley catheter was used to drain the bladder.  A pfannanstiel incision was made with the scalpel and carried down to the fascia with the bovie cautery. The fascia was incised in the midline with the scalpel and carried in a transverse curvilinear manner bilaterally.  The fascia was reflected superiorly and inferiorly off the rectus muscles and the muscles split in the midline.  A bowel free portion of the peritoneum was entered bluntly and then extended in a superior and inferior manner with good visualization of the bowel and bladder.  The Meklit instrument was then placed and the vesico-uterine fascia tented up and incised in a transverse curvilinear manner.  A 2 cm transverse incision was made in the upper portion of the lower uterine segment  until the amnion was exposed.   The incision was extended transversely in a blunt manner.  Clear fluid was noted and the baby delivered in the vertex presentation without complication.  The baby was bulb suctioned and the cord was clamped and cut aftet stripping blood from cord into baby.  The baby was then handed to awaiting Neonatology.  The placenta was then delivered manually and the uterus cleared of all debris.  The uterine incision was then closed with a running lock stitch of 0 monocryl.  An imbricating layer of 0 monocryl was closed as well. Excellent hemostasis of the uterine incision was achieved and the abdomen was cleared with irrigation.  The peritoneum was closed with a running stitch of 2-0 vicryl.  This incorporated the rectus muscles as a separate layer.  The fascia was then closed with a running stitch of 0 vicryl.  The subcutaneous layer was closed with interrupted  stitches of 2-0 plain gut.  The skin was closed with 4-0 vicryl on a Keith needle and provena vac aplied.  The patient tolerated the procedure well and was returned to the recovery room in stable condition.  All counts were correct times three.  [name]

## 2020-03-11 NOTE — Brief Op Note (Signed)
03/10/2020 - 03/11/2020  12:30 AM  PATIENT:  Eldridge Scot  24 y.o. female  PRE-OPERATIVE DIAGNOSIS:  non reassuring fetal heart tones  POST-OPERATIVE DIAGNOSIS:  non reassuring fetal heart tones  PROCEDURE:  Procedure(s): CESAREAN SECTION (N/A) low transverse  SURGEON:  Surgeon(s) and Role:    Carrington Clamp, MD - Primary   ANESTHESIA:   spinal  EBL:  See anesthesia, not excessive   DRAINS: provena wound vac   LOCAL MEDICATIONS USED:  NONE  SPECIMEN:  Source of Specimen:  placenta  DISPOSITION OF SPECIMEN:  PATHOLOGY  COUNTS:  YES  TOURNIQUET:  * No tourniquets in log *  DICTATION: .Note written in EPIC  PLAN OF CARE: Admit to inpatient   PATIENT DISPOSITION:  PACU - hemodynamically stable.   Delay start of Pharmacological VTE agent (>24hrs) due to surgical blood loss or risk of bleeding: not applicable

## 2020-03-11 NOTE — Progress Notes (Signed)
CSW completed chart review and attempted to meet with MOB.  MOB is currently on Mag. CSW will attempt to meet with MOB again on 12/27 to complete clinical assessment for NICU admission.   CSW will continue to offer resources and supports to family while infant remains in NICU.    Mahad Newstrom Boyd-Gilyard, MSW, LCSW Clinical Social Work [phone]  

## 2020-03-11 NOTE — Transfer of Care (Signed)
Immediate Anesthesia Transfer of Care Note  Patient: Debbie Petersen  Procedure(s) Performed: CESAREAN SECTION (N/A )  Patient Location: PACU  Anesthesia Type:Spinal  Level of Consciousness: awake, alert  and oriented  Airway & Oxygen Therapy: Patient Spontanous Breathing  Post-op Assessment: Report given to RN and Post -op Vital signs reviewed and stable  Post vital signs: Reviewed and stable  Last Vitals:  Vitals Value Taken Time  BP 155/107 03/11/20 0031  Temp    Pulse 68 03/11/20 0033  Resp 15 03/11/20 0033  SpO2 100 % 03/11/20 0033  Vitals shown include unvalidated device data.  Last Pain:  Vitals:   03/10/20 2055  TempSrc:   PainSc: 0-No pain      Patients Stated Pain Goal: 3 (03/10/20 1015)  Complications: No complications documented.

## 2020-03-11 NOTE — Progress Notes (Signed)
Pt stood and walked to bathroom to receive peri and foley care. Pt was fine walking to bathroom. When standing to walk back to bed pt became dizzy and pale. Pt sat back on toilet safely. She then drank some water and ate some crackers. Pt was then able to get back up and walk to bed without any problems. RN Yetta Barre updated and in room.

## 2020-03-11 NOTE — Lactation Note (Addendum)
This note was copied from a baby's chart. Lactation Consultation Note  Patient Name: Girl Heavin Sebree [id number] Date: 03/11/2020 Reason for consult: Initial assessment;Primapara;1st time breastfeeding;Preterm <34wks;NICU baby;Infant < 6lbs ( baby is 31 6/7 weeks - 32 0/7 PMA ) C/S, PCOS . Elevated Prolactin.  Age:24 hours  LC visited mom in room 108 and she was resting.  Per mom the pump was set up by the RN  and she has pumped x 2 with small amount of milk on the flange. ( unable to measure ).  LC reviewed supply and demand and recommended pumping 8-10 times in 24 hours. Due to mom being on MagSO4 - LC recommended trying to pump at least 4 -6 times today and then bring it up to the 8-10 as she is feeling better.  Per mom the pumping was comfortable, also + breast changes with pregnancy.  LC provided the NICU education booklet and the [facility]: [facility] Rhonda W brochure.     Maternal Data Has patient been taught Hand Expression?: Yes (per mom was shown by tne RN) Does the patient have breastfeeding experience prior to this delivery?: No  Feeding    LATCH Score                   Interventions Interventions: Breast feeding basics reviewed  Lactation Tools Discussed/Used Tools: Pump Breast pump type: Double-Electric Breast Pump   Consult Status Consult Status: Follow-up Date: 03/12/20 Follow-up type: In-patient    Matilde Sprang Sameul Tagle 03/11/2020, 11:31 AM

## 2020-03-11 NOTE — Progress Notes (Signed)
Subjective: Postpartum Day 1: Cesarean Delivery  Debbie Petersen is doing well this morning. Feeling tired and a little sore, but states pain is well controlled. She is tolerating PO without nausea/vomiting. No headache, vision changes, RUQ pain, chest pain, shortness of breath, calf pain.   Objective: Patient Vitals for the past 24 hrs:  BP Temp Temp src Pulse Resp SpO2  03/11/20 0624 (!) 146/93 -- -- 70 16 96 %  03/11/20 0529 (!) 149/97 -- -- 66 17 97 %  03/11/20 0457 (!) 155/94 -- -- 65 -- --  03/11/20 0438 (!) 163/100 -- -- 66 -- --  03/11/20 0420 (!) 167/104 -- -- 67 -- 96 %  03/11/20 0325 (!) 159/102 -- -- 68 16 97 %  03/11/20 0235 (!) 159/106 -- -- 68 -- 96 %  03/11/20 0220 (!) 162/102 98.7 F (37.1 C) Oral 67 -- 99 %  03/11/20 0145 (!) 154/101 -- -- 64 13 99 %  03/11/20 0135 -- -- -- 65 (!) 0 97 %  03/11/20 0130 (!) 158/99 -- -- 63 (!) 0 99 %  03/11/20 0115 (!) 163/100 -- -- 70 16 99 %  03/11/20 0100 (!) 169/106 -- -- 72 13 100 %  03/11/20 0050 -- -- -- 72 18 100 %  03/11/20 0045 (!) 170/102 -- -- 69 14 100 %  03/11/20 0035 -- 98.6 F (37 C) Oral 73 13 100 %  03/11/20 0030 (!) 155/107 -- -- 67 -- 100 %  03/10/20 2206 138/84 -- -- 84 18 --  03/10/20 1948 (!) 147/89 98.4 F (36.9 C) Oral 63 20 100 %  03/10/20 1636 (!) 158/87 -- -- 67 -- 98 %  03/10/20 1635 -- -- -- -- -- 98 %  03/10/20 1630 (!) 158/87 98.7 F (37.1 C) Oral 65 20 100 %  03/10/20 1520 -- -- -- -- -- 97 %  03/10/20 1515 -- -- -- -- -- 98 %  03/10/20 1510 -- -- -- -- -- 100 %  03/10/20 1125 (!) 152/94 -- -- 66 -- 99 %  03/10/20 1124 (!) 152/94 98.5 F (36.9 C) Oral 68 18 98 %  03/10/20 1123 (!) 159/98 -- -- 68 -- --  03/10/20 1122 (!) 159/98 98.9 F (37.2 C) Oral 63 19 96 %    Physical Exam:  General: alert, cooperative and no distress Lochia: appropriate Uterine Fundus: firm Incision: covered with Prevena dressing, minimal output in container DVT Evaluation: No evidence of DVT seen on physical exam. No  cords or calf tenderness. No significant calf/ankle edema.  Recent Labs    03/10/20 2219 03/11/20 0518  HGB 13.0 11.7*  HCT 36.4 33.2*    Assessment/Plan: Status post Cesarean section. Doing well postoperatively.  Continue current care. Debbie Petersen G1P0101 POD#1 sp primary c-section at [redacted]w[redacted]d for NRFHT 1. PPC: continue routine postoperative care - Prevena wound vac in place x 7 days 2. Severe preeclampsia: on magnesium for seizure prophylaxis, continue x 24 hours.  - Bps currently mild range on current regimen, continue labetalol 600mg  q8h and ProcardiaXL 60mg  BID. Will continue to monitor blood pressures and adjust antihypertensives as needed.  3. Vaccines: s/p COVID, flu, tdap vaccines. Rubella immune 4. Rh pos   03/11/2020, 8:28 AM

## 2020-03-12 MED ORDER — ENOXAPARIN SODIUM 40 MG/0.4ML ~~LOC~~ SOLN: 40.0000 mg | SUBCUTANEOUS | Status: DC

## 2020-03-12 MED ORDER — ENOXAPARIN SODIUM 80 MG/0.8ML ~~LOC~~ SOLN
70.0000 mg | SUBCUTANEOUS | Status: DC
  Administered 2020-03-12 – 2020-03-13 (×2): 70 mg via SUBCUTANEOUS
  Filled 2020-03-12 (×2): qty 0.8

## 2020-03-12 NOTE — Progress Notes (Addendum)
Subjective: Postpartum Day 2: Cesarean Delivery Debbie Petersen is doing well this morning. States pain is well controlled. She is tolerating PO without nausea/vomiting. No headache, vision changes, RUQ pain, chest pain, shortness of breath, calf pain.   Objective: Patient Vitals for the past 24 hrs:  BP Temp Temp src Pulse Resp SpO2  03/12/20 0616 129/66 -- -- 72 -- --  03/12/20 0424 133/75 -- -- 72 -- --  03/12/20 0400 (!) 152/94 -- -- 74 -- 98 %  03/12/20 0020 (!) 148/89 -- -- 65 20 98 %  03/11/20 2228 (!) 156/87 -- -- 65 19 97 %  03/11/20 2030 (!) 154/89 -- -- 69 -- 98 %  03/11/20 2015 (!) 166/94 98.6 F (37 C) Oral 72 20 99 %  03/11/20 1825 -- -- -- -- 19 --  03/11/20 1710 -- -- -- -- 20 --  03/11/20 1605 -- -- -- -- 18 --  03/11/20 1525 (!) 156/89 98.5 F (36.9 C) Oral 69 18 98 %  03/11/20 1415 -- -- -- -- 16 --  03/11/20 1300 -- -- -- -- 20 --  03/11/20 1150 (!) 148/92 98.4 F (36.9 C) Oral 70 18 97 %  03/11/20 1105 -- -- -- -- 19 --  03/11/20 1000 -- -- -- -- 18 --  03/11/20 0910 -- -- -- -- 20 --  03/11/20 0832 (!) 154/97 98 F (36.7 C) Oral 73 18 100 %    Physical Exam:  General: alert, cooperative and no distress Lochia: appropriate Uterine Fundus: firm Incision: covered with Prevena dressing, minimal output in container DVT Evaluation: No evidence of DVT seen on physical exam. No cords or calf tenderness. No significant calf/ankle edema.  Recent Labs    03/10/20 2219 03/11/20 0518  HGB 13.0 11.7*  HCT 36.4 33.2*    Assessment/Plan: Status post Cesarean section. Doing well postoperatively.  Continue current care. Janayah R Deluna G1P0101 POD#2 sp primary c-section at [redacted]w[redacted]d for NRFHT 1. PPC: continue routine postoperative care - Prevena wound vac in place x 7 days - Start lovenox for VTE prophylaxis while inpatient 2. Severe preeclampsia: s/p magnesium for 24 hours postpartum - Bps 150-160s/80-90s overnight, increased labetalol to 800mg  q8h, continue Procardia  60XL BID 3. Vaccines: s/p COVID, flu, tdap vaccines. Rubella immune 4. Rh pos 5. Dispo: anticipate d/c in 1-2 days  03/12/2020, 7:41 AM

## 2020-03-12 NOTE — Lactation Note (Signed)
This note was copied from a baby's chart. Lactation Consultation Note LC to patient's room on [facility] for f/u visit. Pt very sleepy and "feels worse today." She was able to pump 4 times today and will attempt to pump q 3 hours today. Reviewed previously taught pumping ed. Patient was provided with the opportunity to ask questions. All concerns were addressed.  Will plan follow up visit.   Patient Name: Debbie Petersen [facility]) Today's Date: 03/12/2020 Reason for consult: NICU baby;Follow-up assessment Age:44 hours   Consult Status Consult Status: Follow-up Follow-up type: In-patient    Elder Negus, MA IBCLC 03/12/2020, 10:00 AM

## 2020-03-13 MED ORDER — LABETALOL HCL 200 MG PO TABS: 800.0000 mg | ORAL_TABLET | Freq: Three times a day (TID) | ORAL | 11 refills | Status: AC

## 2020-03-13 MED ORDER — IBUPROFEN 600 MG PO TABS: 600.0000 mg | ORAL_TABLET | Freq: Four times a day (QID) | ORAL | 1 refills | Status: AC | PRN

## 2020-03-13 MED ORDER — NIFEDIPINE ER OSMOTIC RELEASE 30 MG PO TB24: 120.0000 mg | ORAL_TABLET | Freq: Every day | ORAL | 11 refills | Status: AC

## 2020-03-13 MED ORDER — HYDRALAZINE HCL 10 MG PO TABS: 10.0000 mg | ORAL_TABLET | Freq: Four times a day (QID) | ORAL | 11 refills | Status: AC

## 2020-03-13 MED ORDER — HYDRALAZINE HCL 10 MG PO TABS
10.0000 mg | ORAL_TABLET | Freq: Four times a day (QID) | ORAL | Status: DC
  Administered 2020-03-13: 10 mg via ORAL
  Filled 2020-03-13: qty 1

## 2020-03-13 MED ORDER — NIFEDIPINE ER OSMOTIC RELEASE 30 MG PO TB24
120.0000 mg | ORAL_TABLET | Freq: Every day | ORAL | Status: DC
  Administered 2020-03-13: 120 mg via ORAL
  Filled 2020-03-13: qty 4

## 2020-03-13 MED ORDER — OXYCODONE HCL 5 MG PO TABS: 5.0000 mg | ORAL_TABLET | ORAL | 0 refills | Status: AC | PRN

## 2020-03-13 NOTE — Progress Notes (Signed)
Notified by RN that Debbie Petersen' father died on April 08, 2023 and she is requesting discharge home today.   I spoke with Jon Gills over the phone and we reviewed her blood pressures overnight - severe range 161/92 just prior to her AM labetalol dose. After labetalol 800mg  PO, BP 132/87. Since then, Bps 135/83, then 149/96. She is currently at maximum dose of labetalol and Procardia XL. Discussed my plan was to add hydralazine today and hopefully discharge tomorrow, however given extenuating circumstances and known reliability of patient, who has chronic hypertension and has been checking Bps at home throughout pregnancy, it is reasonable to discharge home with close outpatient follow up.  She agrees to home BP monitoring and compliance with BP medications, as well as appt on Tuesday (2 days)  for BP review. She agrees to call if severe range BP or symptomatic low BP, or If she develops persistent headache, vision changes, RUQ pain, chest pain, shortness of breath.   Will start hydralazine 10mg  q6h now and discharge home.   Saturday, MD 03/13/20 12:54 PM

## 2020-03-13 NOTE — Lactation Note (Signed)
This note was copied from a baby's chart. Lactation Consultation Note LC to room for f/u visit. Mom very sleepy. She states she pumps every 2-3 hours and "gets a little bit" of colostrum. Mom is aware of LC services and will request further assistance today, prn. This visit was brief; no charge.  Patient Name: Debbie Petersen [facility]) Today's Date: 03/13/2020 Reason for consult: NICU baby;Follow-up assessment Age:24 hours   Consult Status Consult Status: Follow-up Follow-up type: In-patient   Elder Negus, MA IBCLC 03/13/2020, 9:04 AM

## 2020-03-13 NOTE — Progress Notes (Signed)
Subjective: Postpartum Day 3: Cesarean Delivery  Patient seen and examined in NICU.   Debbie Petersen is doing well this morning. States pain is well controlled. She is tolerating PO without nausea/vomiting. No headache, vision changes, RUQ pain, chest pain, shortness of breath, calf pain.  Objective: Patient Vitals for the past 24 hrs:  BP Temp Temp src Pulse Resp SpO2  03/13/20 0605 132/87 -- -- 73 18 99 %  03/13/20 0535 (!) 161/92 -- -- 71 18 99 %  03/13/20 0520 (!) 161/93 98.6 F (37 C) Oral 75 18 99 %  03/13/20 0101 (!) 148/85 -- -- 78 18 100 %  03/12/20 2043 (!) 157/87 98.6 F (37 C) Oral 72 18 --  03/12/20 1516 140/77 98.3 F (36.8 C) Oral 82 18 100 %  03/12/20 1140 (!) 149/84 98.6 F (37 C) Oral 77 18 100 %  03/12/20 0845 140/83 98.4 F (36.9 C) Oral 73 18 98 %    Physical Exam:  General:alert, cooperative and no distress Lochia:appropriate Uterine Fundus:firm Incision:covered with Prevena dressing, minimal output in container DVT Evaluation:No evidence of DVT seen on physical exam. No cords or calf tenderness. No significant calf/ankle edema.  Recent Labs    03/10/20 2219 03/11/20 0518  HGB 13.0 11.7*  HCT 36.4 33.2*    Assessment/Plan: Status post Cesarean section.Doing well postoperatively. Continue current care. Debbie R TynerG1P0101 POD#3sp primary c-section at74w6d for NRFHT 1.PPC: continue routine postoperative care - Prevena wound vac in place x 7 days - lovenox for VTE prophylaxis while inpatient 2.Severe preeclampsia: s/p magnesium for 24 hours postpartum - Bps ranging from 129-161/66-93 over past 24 hours. Continue labetalol 800mg  q8h, change Procardia XL to 120mg  daily. May need to add 3rd agent.  3. Vaccines: s/p COVID, flu, tdap vaccines. Rubella immune 4. Rh pos 5. Dispo: remain inpatient for BP control  03/13/2020, 7:47 AM

## 2020-03-13 NOTE — Discharge Summary (Signed)
Postpartum Discharge Summary      Patient Name: Debbie Petersen DOB: [birth year] MRN: [id number]  Date of admission: 03/01/2020 Delivery date:03/10/2020  Delivering provider: Bobbye Charleston  Date of discharge: 03/13/2020  Admitting diagnosis: Severe preeclampsia, third trimester [O14.13] Intrauterine pregnancy: [redacted]w[redacted]d    Secondary diagnosis:  Active Problems:   Severe preeclampsia, third trimester  Additional problems: Chronic hypertension, IUGR    Discharge diagnosis: Preterm Pregnancy Delivered and CHTN with superimposed preeclampsia                                              Post partum procedures:none Augmentation: N/A Complications: None  Hospital course:   24y.o. yo G1P0101 at [id number] admitted to the hospital 03/01/2020 for chronic hypertension with superimposed severe preeclampsia. She received magnesium for seizure prophylaxis on admission and received a course of betamethasone. Antihypertensives were increased over the following days to control blood pressures. On 12/23, fetal monitoring demonstrated repetitive decelerations and dopplers with some absent flow, as well as new IUGR <1%ile. She underwent a cesarean delivery at that time.  Membrane Rupture Time/Date: 11:22 PM ,03/10/2020   Delivery Method:C-Section, Low Transverse  Details of operation can be found in separate operative note. She received magnesium for 24 hours post-delivery. Postpartum course complicated by elevated blood pressures requiring increase to maximum doses of ProcardiaXL and labetalol and the addition of hydralazine. On POD#3 (03/13/20), she was discharged home per her request due to family circumstances with blood pressures mostly in mild range. She is ambulating, tolerating a regular diet, passing flatus, and urinating well.        Newborn Data: Birth date:03/10/2020  Birth time:11:24 PM  Gender:Female  Living status:Living  Apgars:7 ,8  Weight:1220 g     Magnesium Sulfate received:  Yes: Seizure prophylaxis BMZ received: Yes Rhophylac:N/A MMR:N/A T-DaP:Given prenatally Flu: N/A Transfusion:No  Physical exam  Vitals:   03/13/20 0600 03/13/20 0605 03/13/20 0812 03/13/20 1226  BP:  132/87 135/83 (!) 149/96  Pulse:  73 71 75  Resp:  18 17 18   Temp:   98.6 F (37 C) 98.8 F (37.1 C)  TempSrc:   Oral Oral  SpO2: 98% 99% 96% 99%  Weight:      Height:       General: alert, cooperative and no distress Lochia: appropriate Uterine Fundus: firm Incision: Healing well with no significant drainage, covered with Prevena wound vac DVT Evaluation: No evidence of DVT seen on physical exam. Labs: Lab Results  Component Value Date   WBC 21.6 (H) 03/11/2020   HGB 11.7 (L) 03/11/2020   HCT 33.2 (L) 03/11/2020   MCV 85.3 03/11/2020   PLT 184 03/11/2020   CMP Latest Ref Rng & Units 03/06/2020  Glucose 70 - 99 mg/dL 93  BUN 6 - 20 mg/dL 14  Creatinine 0.44 - 1.00 mg/dL 0.80  Sodium 135 - 145 mmol/L 134(L)  Potassium 3.5 - 5.1 mmol/L 4.8  Chloride 98 - 111 mmol/L 106  CO2 22 - 32 mmol/L 23  Calcium 8.9 - 10.3 mg/dL 8.5(L)  Total Protein 6.5 - 8.1 g/dL 4.1(L)  Total Bilirubin 0.3 - 1.2 mg/dL 0.4  Alkaline Phos 38 - 126 U/L 64  AST 15 - 41 U/L 22  ALT 0 - 44 U/L 22   Edinburgh Score: Edinburgh Postnatal Depression Scale Screening Tool 03/11/2020  I have been  able to laugh and see the funny side of things. 0  I have looked forward with enjoyment to things. 0  I have blamed myself unnecessarily when things went wrong. 2  I have been anxious or worried for no good reason. 0  I have felt scared or panicky for no good reason. 0  Things have been getting on top of me. 1  I have been so unhappy that I have had difficulty sleeping. 0  I have felt sad or miserable. 0  I have been so unhappy that I have been crying. 0  The thought of harming myself has occurred to me. 0  Edinburgh Postnatal Depression Scale Total 3      After visit meds:  Allergies as of  03/13/2020      Reactions   Metformin Other (See Comments)   GI upset      Medication List    STOP taking these medications   aspirin 81 MG chewable tablet   multivitamin-prenatal 27-0.8 MG Tabs tablet   [name] 3-0.02 MG tablet Generic drug: drospirenone-ethinyl estradiol     TAKE these medications   hydrALAZINE 10 MG tablet Commonly known as: APRESOLINE Take 1 tablet (10 mg total) by mouth every 6 (six) hours.   ibuprofen 600 MG tablet Commonly known as: ADVIL Take 1 tablet (600 mg total) by mouth every 6 (six) hours as needed for mild pain, moderate pain or cramping.   labetalol 200 MG tablet Commonly known as: NORMODYNE Take 4 tablets (800 mg total) by mouth every 8 (eight) hours.   NIFEdipine 30 MG 24 hr tablet Commonly known as: PROCARDIA-XL/NIFEDICAL-XL Take 4 tablets (120 mg total) by mouth daily. Start taking on: March 14, 2020 What changed: how much to take   oxyCODONE 5 MG immediate release tablet Commonly known as: Roxicodone Take 1 tablet (5 mg total) by mouth every 4 (four) hours as needed for severe pain.            Discharge Care Instructions  (From admission, onward)         Start     Ordered   03/13/20 0000  Discharge wound care:       Comments: Remove Prevena dressing 7 days after surgery   03/13/20 1306           Discharge home in stable condition Infant Feeding: Bottle and Breast Infant Disposition:NICU Discharge instruction: per After Visit Summary and Postpartum booklet. Activity: Advance as tolerated. Pelvic rest for 6 weeks.  Diet: low salt diet Anticipated Birth Control: Unsure Postpartum Appointment:4 weeks Additional Postpartum F/U: BP check 2-3 days Future Appointments:No future appointments. Follow up Visit:  Follow-up Information    Taam-Akelman, Lawrence Santiago, MD. Schedule an appointment as soon as possible for a visit in 2 day(s).   Specialty: Obstetrics and Gynecology Contact information: [address] [place] [address] [phone]                   03/13/2020 Rowland Lathe, MD

## 2020-03-14 LAB — SURGICAL PATHOLOGY

## 2020-03-15 NOTE — Anesthesia Postprocedure Evaluation (Signed)
Anesthesia Post Note  Patient: Debbie Petersen  Procedure(s) Performed: CESAREAN SECTION (N/A )     Patient location during evaluation: PACU Anesthesia Type: Spinal Level of consciousness: awake and alert Pain management: pain level controlled Vital Signs Assessment: post-procedure vital signs reviewed and stable Respiratory status: spontaneous breathing and respiratory function stable Cardiovascular status: blood pressure returned to baseline and stable Postop Assessment: spinal receding and no apparent nausea or vomiting Anesthetic complications: no   No complications documented.  Last Vitals:  Vitals:   03/13/20 1226 03/13/20 1412  BP: (!) 149/96 (!) 142/74  Pulse: 75 90  Resp: 18   Temp: 37.1 C   SpO2: 99%     Last Pain:  Vitals:   03/13/20 1226  TempSrc: Oral  PainSc:                  Beryle Lathe

## 2020-03-26 ENCOUNTER — Ambulatory Visit: Payer: Self-pay

## 2020-03-26 NOTE — Lactation Note (Signed)
This note was copied from a baby's chart. Lactation Consultation Note  Patient Name: Girl Aritha Huckeba [id number] Date: 03/26/2020 Reason for consult: Follow-up assessment;NICU baby Age:25 wk.o.  LC to room for f/u visit with mother. Mom continues to pump q 3 hours with about 1 oz yield per pumping. We reviewed hand expression before and after pumping as well as breast compressions during pumping to maximize milk removal. LC also encouraged mom to pump at baby's bedside. Mom agreeable. Will plan f/u visit next week. Mother was provided with the opportunity to ask questions. All concerns were addressed.    Consult Status Consult Status: Follow-up Follow-up type: In-patient    Elder Negus, MA IBCLC 03/26/2020, 10:25 AM

## 2020-04-13 ENCOUNTER — Ambulatory Visit: Payer: Self-pay

## 2020-04-13 NOTE — Lactation Note (Signed)
This note was copied from a baby's chart. Lactation Consultation Note  Patient Name: Debbie Petersen [id number] Date: 04/13/2020 Reason for consult: Follow-up assessment;NICU baby Age:25 wk.o.  LC to room for f/u visit. Mom tested + for Covid and will return on 1-31. Grandmother present and called mother. Per mom, she continues to pump q 3 hours but only pumps about 1 oz per day. We reviewed pumping strategies and hand expression to increase production. Also discussed IDF and possibility of supply increasing when baby is able to directly bf. Mother was provided with the opportunity to ask questions. All concerns were addressed.  Will plan f/u visit next week.  Consult Status Consult Status: Follow-up Follow-up type: In-patient   Elder Negus, MA IBCLC 04/13/2020, 3:38 PM

## 2020-04-20 ENCOUNTER — Ambulatory Visit: Payer: Self-pay

## 2020-04-20 NOTE — Lactation Note (Signed)
This note was copied from a baby's chart. Lactation Consultation Note  Patient Name: Girl Carnetta Losada [id number] Date: 04/20/2020 Reason for consult: NICU baby;Follow-up assessment Age:25 wk.o.  LC spoke with mom. She is no longer pumping because of low milk supply. We reviewed non-nutritive suckling at breast prn. She is aware of LC services if she needs further lactation support. LC services are complete at this time for this patient.  Consult Status Consult Status: Complete Follow-up type: In-patient   Elder Negus, MA IBCLC 04/20/2020, 3:54 PM

## 2021-05-11 IMAGING — US US MFM FETAL BPP W/O NON-STRESS
1 series · 15 of 28 positions shown · non-contrast
Comparison: none

[Series 1: us mfm fetal bpp w/o non-stress · 34 acquisitions, 15 frames shown]
[im 1/34]
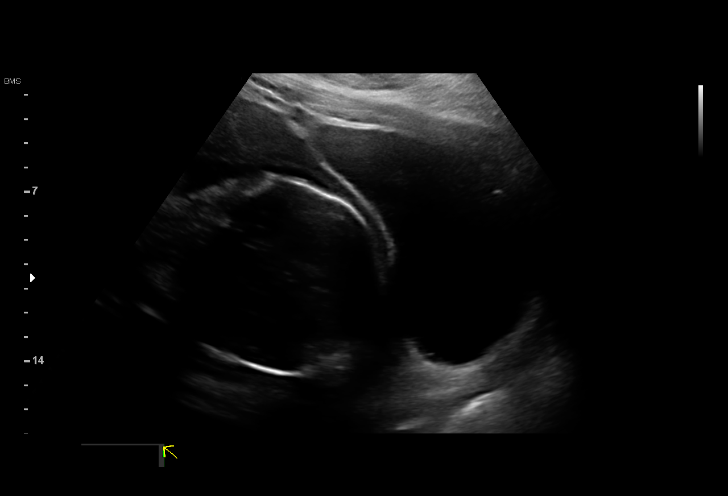
[im 3/34]
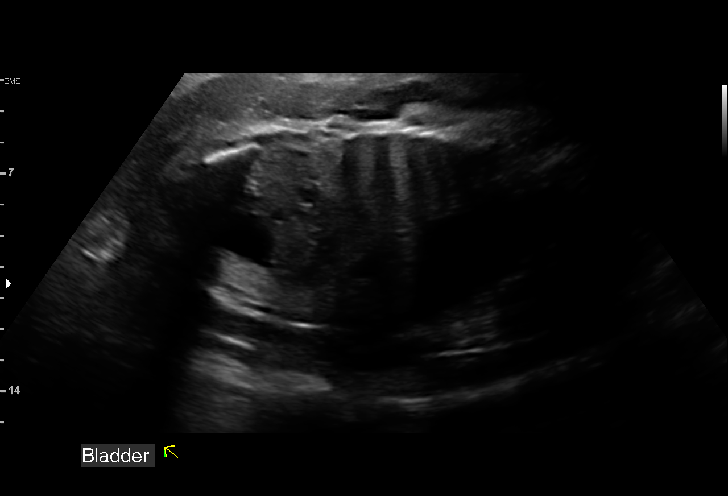
[im 5/34]
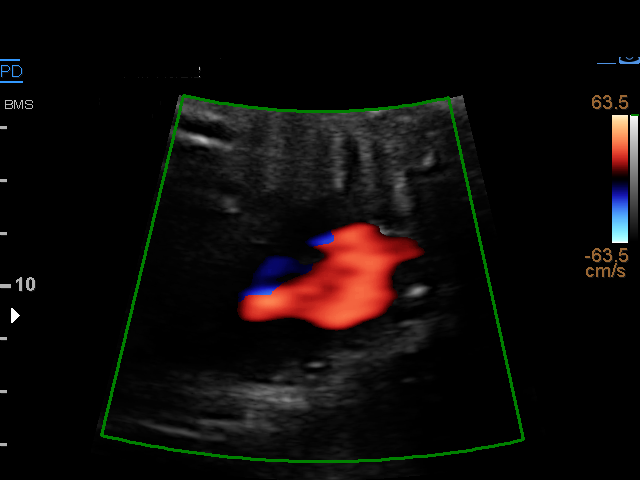
[im 8/34]
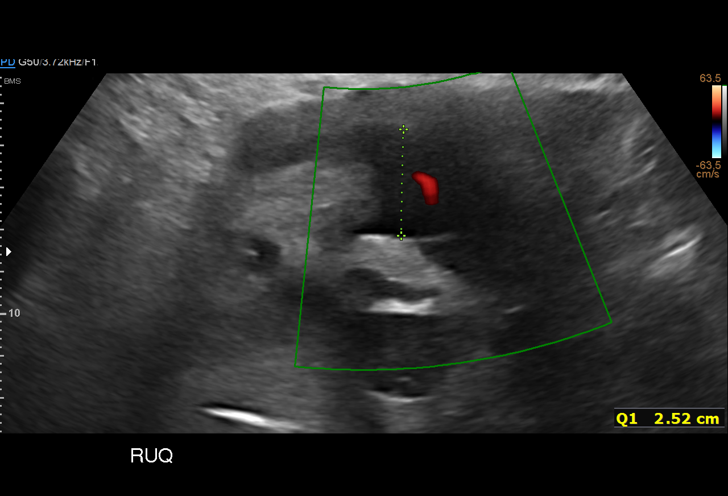
[im 10/34]
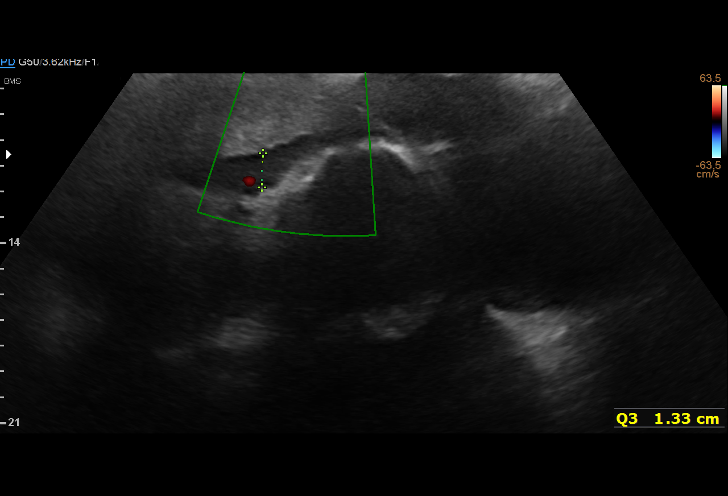
[im 13/34]
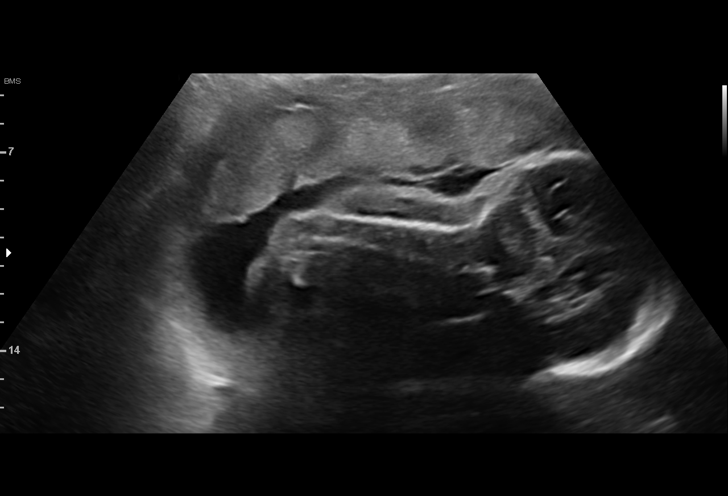
[im 15/34]
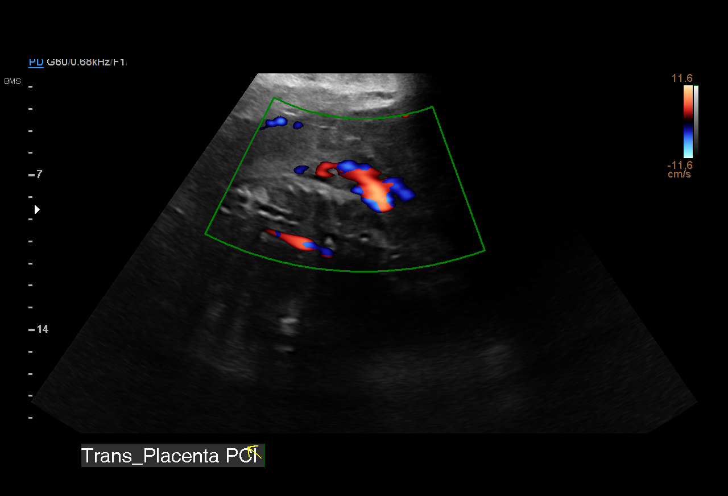
[im 18/34]
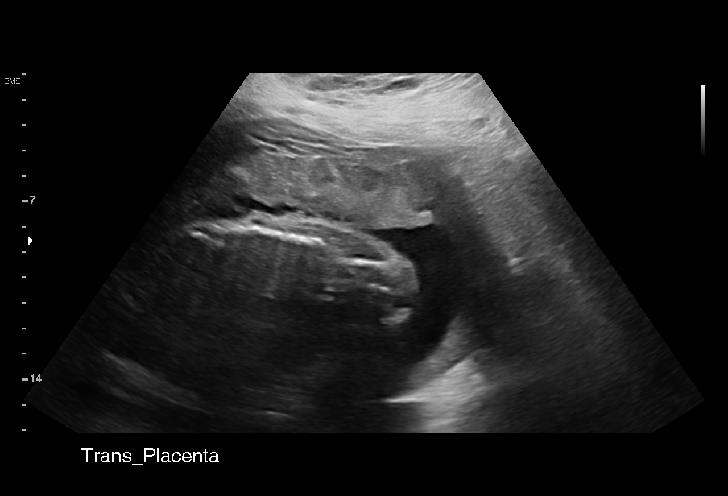
[im 19/34]
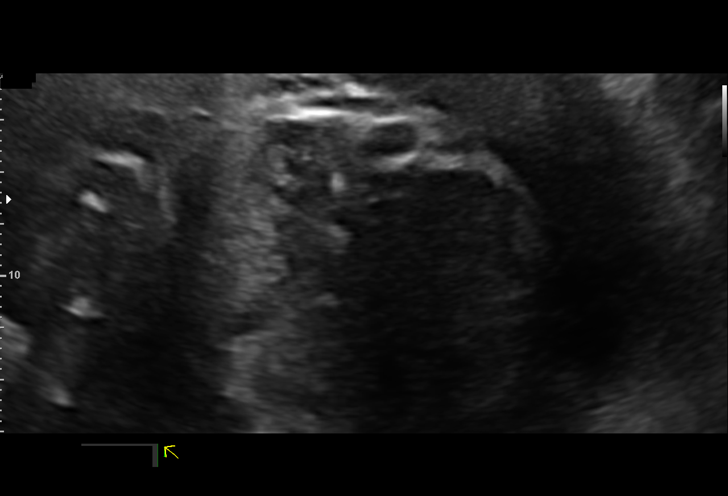
[im 21/34]
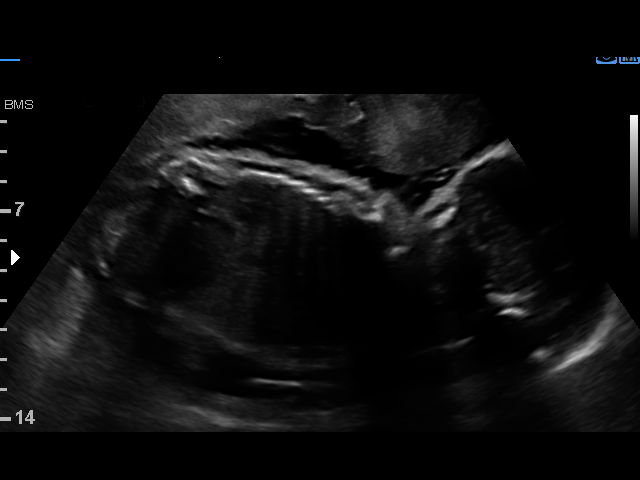
[im 24/34]
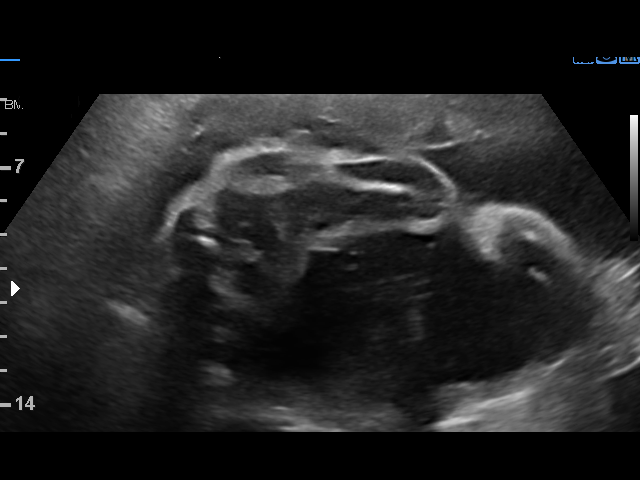
[im 26/34]
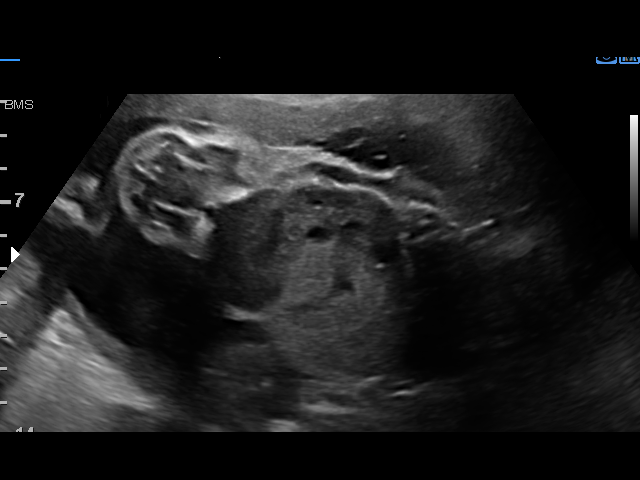
[im 29/34]
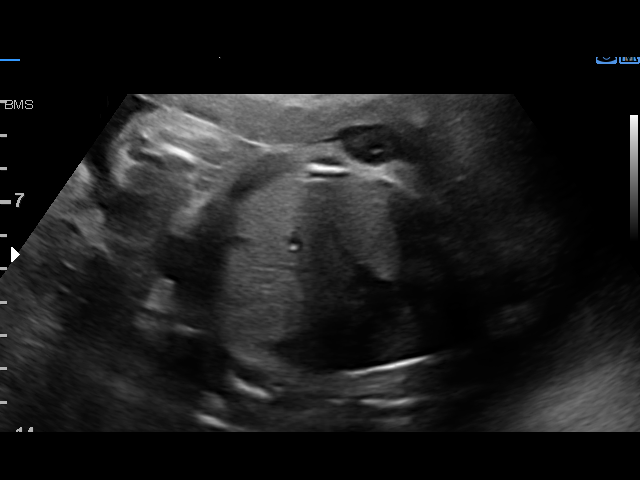
[im 31/34]
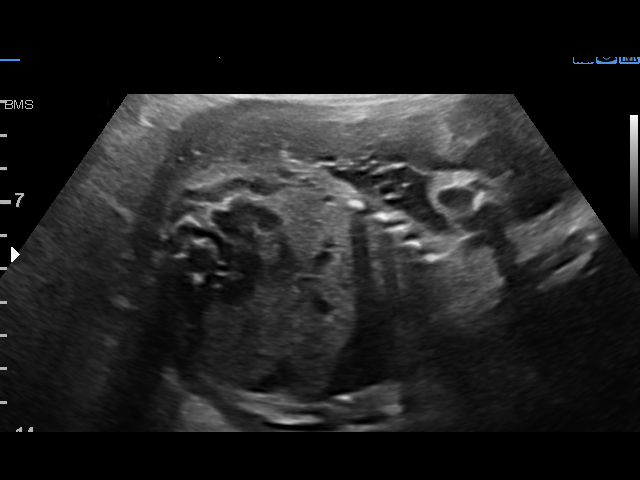
[im 34/34]
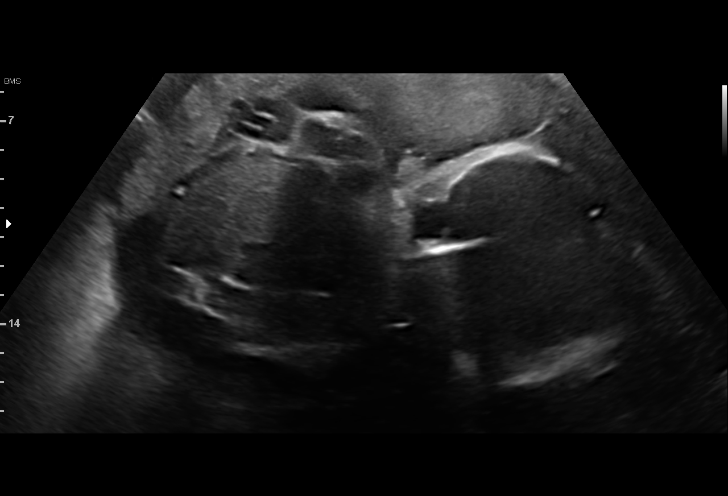

[15 of 28 positions shown; findings below may reference images not displayed]

Indications

 Hypertension - Chronic with superimposed
 preeclampsia (Labetolol)
 30 weeks gestation of pregnancy
 Obesity complicating pregnancy, third
 trimester (BMI 53)
Fetal Evaluation

 Num Of Fetuses:          1
 Fetal Heart Rate(bpm):   122
 Cardiac Activity:        Observed
 Presentation:            Cephalic
 Placenta:                Anterior
 P. Cord Insertion:       Visualized, central

 Amniotic Fluid
 AFI FV:      Within normal limits

 AFI Sum(cm)     %Tile       Largest Pocket(cm)
 9.96            14

 RUQ(cm)       RLQ(cm)        LUQ(cm)        LLQ(cm)

Biophysical Evaluation

 Amniotic F.V:   Pocket => 2 cm              F. Tone:         Observed
 F. Movement:    Observed                    Score:           [DATE]
 F. Breathing:   Not Observed
OB History

 Gravidity:     1
 Living:        0
Gestational Age

 LMP:            30w 6d       Date:  07/31/19                   EDD:  05/06/20
 Best:           30w 6d    Det. By:  LMP  (07/31/19)            EDD:  05/06/20
Anatomy

 Diaphragm:              Appears normal         Kidneys:                Appear normal
 Stomach:                Appears normal, left   Bladder:                Appears normal
                         sided
Cervix Uterus Adnexa

 Cervix
 Not visualized (advanced GA >97wks)
Comments

 This patient has been hospitalized due to severe preeclampsia.
 She is currently being treated with magnesium for maternal
 seizure prophylaxis and fetal neuroprotection.
 A biophysical profile performed today was [DATE].  She
 received a -2 for fetal breathing movements that did not meet
 criteria.  She should have an NST performed later today.
 There was normal amniotic fluid noted on today's ultrasound
 exam.
 The fetus is in the vertex presentation.
